# Patient Record
Sex: Female | Born: 1955
Health system: Southern US, Community
[De-identification: ages and names within clinical notes are randomized; demographics above are authoritative.]

## PROBLEM LIST (undated history)

## (undated) DIAGNOSIS — R7303 Prediabetes: Secondary | ICD-10-CM

## (undated) DIAGNOSIS — M1710 Unilateral primary osteoarthritis, unspecified knee: Secondary | ICD-10-CM

## (undated) DIAGNOSIS — N950 Postmenopausal bleeding: Secondary | ICD-10-CM

## (undated) DIAGNOSIS — I10 Essential (primary) hypertension: Secondary | ICD-10-CM

## (undated) DIAGNOSIS — M549 Dorsalgia, unspecified: Secondary | ICD-10-CM

## (undated) DIAGNOSIS — M2550 Pain in unspecified joint: Secondary | ICD-10-CM

## (undated) DIAGNOSIS — Z6830 Body mass index (BMI) 30.0-30.9, adult: Secondary | ICD-10-CM

## (undated) DIAGNOSIS — E669 Obesity, unspecified: Secondary | ICD-10-CM

## (undated) DIAGNOSIS — E7800 Pure hypercholesterolemia, unspecified: Secondary | ICD-10-CM

## (undated) DIAGNOSIS — E785 Hyperlipidemia, unspecified: Secondary | ICD-10-CM

## (undated) HISTORY — DX: Pure hypercholesterolemia, unspecified: E78.00

## (undated) HISTORY — PX: COLONOSCOPY: SHX174

## (undated) HISTORY — DX: Obesity, unspecified: E66.9

## (undated) HISTORY — DX: Dorsalgia, unspecified: M54.9

## (undated) HISTORY — DX: Pain in unspecified joint: M25.50

## (undated) HISTORY — DX: Prediabetes: R73.03

## (undated) HISTORY — DX: Unilateral primary osteoarthritis, unspecified knee: M17.10

## (undated) HISTORY — DX: Body mass index (BMI) 30.0-30.9, adult: Z68.30

---

## 1997-08-21 ENCOUNTER — Ambulatory Visit (HOSPITAL_COMMUNITY): Admission: RE | Admit: 1997-08-21 | Discharge: 1997-08-21 | Payer: Self-pay | Admitting: Internal Medicine

## 1998-09-21 ENCOUNTER — Ambulatory Visit (HOSPITAL_COMMUNITY): Admission: RE | Admit: 1998-09-21 | Discharge: 1998-09-21 | Payer: Self-pay | Admitting: Internal Medicine

## 1998-09-21 ENCOUNTER — Encounter: Payer: Self-pay | Admitting: Internal Medicine

## 1998-12-16 ENCOUNTER — Ambulatory Visit (HOSPITAL_COMMUNITY): Admission: RE | Admit: 1998-12-16 | Discharge: 1998-12-16 | Payer: Self-pay | Admitting: Internal Medicine

## 1999-09-23 ENCOUNTER — Encounter: Payer: Self-pay | Admitting: Internal Medicine

## 1999-09-23 ENCOUNTER — Ambulatory Visit (HOSPITAL_COMMUNITY): Admission: RE | Admit: 1999-09-23 | Discharge: 1999-09-23 | Payer: Self-pay | Admitting: Internal Medicine

## 2001-11-05 ENCOUNTER — Encounter: Payer: Self-pay | Admitting: Internal Medicine

## 2001-11-05 ENCOUNTER — Ambulatory Visit (HOSPITAL_COMMUNITY): Admission: RE | Admit: 2001-11-05 | Discharge: 2001-11-05 | Payer: Self-pay | Admitting: Internal Medicine

## 2002-11-21 ENCOUNTER — Encounter: Payer: Self-pay | Admitting: Internal Medicine

## 2002-11-21 ENCOUNTER — Ambulatory Visit (HOSPITAL_COMMUNITY): Admission: RE | Admit: 2002-11-21 | Discharge: 2002-11-21 | Payer: Self-pay | Admitting: Internal Medicine

## 2004-01-18 ENCOUNTER — Ambulatory Visit (HOSPITAL_COMMUNITY): Admission: RE | Admit: 2004-01-18 | Discharge: 2004-01-18 | Payer: Self-pay | Admitting: Internal Medicine

## 2005-12-27 ENCOUNTER — Emergency Department (HOSPITAL_COMMUNITY): Admission: EM | Admit: 2005-12-27 | Discharge: 2005-12-27 | Payer: Self-pay | Admitting: Emergency Medicine

## 2005-12-29 ENCOUNTER — Emergency Department (HOSPITAL_COMMUNITY): Admission: EM | Admit: 2005-12-29 | Discharge: 2005-12-29 | Payer: Self-pay | Admitting: Emergency Medicine

## 2007-05-16 ENCOUNTER — Ambulatory Visit (HOSPITAL_COMMUNITY): Admission: RE | Admit: 2007-05-16 | Discharge: 2007-05-16 | Payer: Self-pay | Admitting: Internal Medicine

## 2007-05-21 ENCOUNTER — Encounter: Admission: RE | Admit: 2007-05-21 | Discharge: 2007-05-21 | Payer: Self-pay | Admitting: Internal Medicine

## 2008-05-21 ENCOUNTER — Ambulatory Visit (HOSPITAL_COMMUNITY): Admission: RE | Admit: 2008-05-21 | Discharge: 2008-05-21 | Payer: Self-pay | Admitting: Internal Medicine

## 2009-01-14 ENCOUNTER — Ambulatory Visit (HOSPITAL_COMMUNITY): Admission: RE | Admit: 2009-01-14 | Discharge: 2009-01-14 | Payer: Self-pay | Admitting: Gastroenterology

## 2009-01-14 ENCOUNTER — Encounter (INDEPENDENT_AMBULATORY_CARE_PROVIDER_SITE_OTHER): Payer: Self-pay | Admitting: Gastroenterology

## 2009-05-24 ENCOUNTER — Emergency Department (HOSPITAL_COMMUNITY): Admission: EM | Admit: 2009-05-24 | Discharge: 2009-05-24 | Payer: Self-pay | Admitting: Family Medicine

## 2009-06-04 ENCOUNTER — Ambulatory Visit (HOSPITAL_COMMUNITY): Admission: RE | Admit: 2009-06-04 | Discharge: 2009-06-04 | Payer: Self-pay | Admitting: Internal Medicine

## 2010-07-24 ENCOUNTER — Encounter: Payer: Self-pay | Admitting: Internal Medicine

## 2010-11-15 NOTE — Op Note (Signed)
Anne [name], WINWARD                ACCOUNT NO.:  [id number]   MEDICAL RECORD NO.:  [id number]          PATIENT TYPE:  AMB   LOCATION:  ENDO                         FACILITY:  [facility]   PHYSICIAN:  Anselmo Rod, M.D.  DATE OF BIRTH:  Mar 26, 1956   DATE OF PROCEDURE:  01/14/2009  DATE OF DISCHARGE:  01/14/2009                               OPERATIVE REPORT   PROCEDURE PERFORMED:  Colonoscopy with cold snare polypectomy x 1.   ENDOSCOPIST:  Anselmo Rod, M.D.   INSTRUMENT USED:  Pentax video colonoscope.   INDICATIONS FOR PROCEDURE:  A 55 year old black female undergoing  screening colonoscopy to rule out colonic polyps, masses, etc.   PREPROCEDURE PREPARATION:  Informed consent was procured from the  patient.  The patient was fasted for 4 hours prior to the procedure and  prepped with MoviPrep the night prior to the procedure and morning of  the procedure.  Risks and benefits of the procedure including a 10%  missed rate of cancer and polyp were discussed with the patient as well.   PREPROCEDURE PHYSICAL:  VITAL SIGNS:  The patient had stable vital  signs.  NECK:  Supple.  CHEST:  Clear to auscultation.  HEART:  S1, S2 regular.  ABDOMEN:  Abdomen soft with normal bowel sounds.   DESCRIPTION OF PROCEDURE:  The patient was placed in the left lateral  decubitus position and sedated with 175 mg of Propofol and 2 mg of  Versed given intravenously in slow incremental doses.  Once the patient  was adequately sedated and maintained on low-flow oxygen and continuous  cardiac monitoring the Pentax video colonoscope was advanced from the  rectum to the cecum. A small sessile polyp was removed by cold snare  from the cecum.  Isolated diverticulum was seen in the left colon.  Small internal hemorrhoids were seen on retroflexion. The colonic mucosa  appeared healthy with a normal vascular pattern. The patient tolerated  the procedure well without immediate complications.   IMPRESSION:  1. Small sessile polyp removed by cold snare from the cecum.  2. Isolated diverticulum in the left colon.  3. Small internal hemorrhoids seen on retroflexion.   RECOMMENDATIONS:  1. Await pathology results.  2. Avoid all nonsteroidals including aspirin for the next 2 weeks.  3. Repeat colonoscopy depending on pathology results.  4. Outpatient followup as need arises in the future.      Anselmo Rod, M.D.  Electronically Signed     JNM/MEDQ  D:  01/15/2009  T:  01/16/2009  Job:  [id number]

## 2012-03-09 ENCOUNTER — Ambulatory Visit: Payer: Self-pay | Admitting: Family Medicine

## 2012-03-09 VITALS — BP 122/85 | HR 80 | Temp 100.0°F | Resp 16 | Ht 63.5 in | Wt 221.2 lb

## 2012-03-09 DIAGNOSIS — M79643 Pain in unspecified hand: Secondary | ICD-10-CM

## 2012-03-09 DIAGNOSIS — S61419A Laceration without foreign body of unspecified hand, initial encounter: Secondary | ICD-10-CM

## 2012-03-09 DIAGNOSIS — M25539 Pain in unspecified wrist: Secondary | ICD-10-CM

## 2012-03-09 NOTE — Progress Notes (Signed)
[facility] [address] [place] [address] [phone]  Date:  03/09/2012   Name:  Anne [name]   DOB:  07-23-1955   MRN:  [id number]  PCP:  No primary provider on file.    Chief Complaint: Laceration   History of Present Illness:  Anne [name] is a 56 y.o. very pleasant female patient who presents with the following:  She was looking at some new knives at [facility] today when she accidentally cut her right wrist on the dorsal side.  Her tetanus shot is UTD, it was a clean wound.  She is otherwise unhurt and has not other concerns today.  She does not really feel that she has numbness in her hand, but the hand does feel a little strange- she will have sharp pains down the fingers sometimes, and she might notice a little tingling.  Her tetanus shot is UTD  There is no problem list on file for this patient.   No past medical history on file.  No past surgical history on file.  History  Substance Use Topics  . Smoking status: Never Smoker   . Smokeless tobacco: Not on file  . Alcohol Use: Not on file    No family history on file.  No Known Allergies  Medication list has been reviewed and updated.  No current outpatient prescriptions on file prior to visit.    Review of Systems:  As per HPI- otherwise negative.   Physical Examination: Filed Vitals:   03/09/12 1451  BP: 170/110  Pulse: 80  Temp: 100 F (37.8 C)  Resp: 16   Filed Vitals:   03/09/12 1451  Height: 5' 3.5" (1.613 m)  Weight: 221 lb 3.2 oz (100.336 kg)   Body mass index is 38.57 kg/(m^2). Ideal Body Weight: Weight in (lb) to have BMI = 25: 143.1    GEN: WDWN, NAD, Non-toxic, Alert & Oriented x 3 HEENT: Atraumatic, Normocephalic.  Ears and Nose: No external deformity. EXTR: No clubbing/cyanosis/edema NEURO: Normal gait.  PSYCH: Normally interactive. Conversant. Not depressed or anxious appearing.  Calm demeanor.  Right UE: there is a small, clean wound on the dorsum  of her right wrist near the hand.  It does not appear to be deep.  She has good strength of her hand, except she is slightly limited in her grip by pain. In comparison to the other hand she cannot discern any significant difference of sensation.  Able to flex and extend the wrist, and adduct/ abduct fingers normally.    Assessment and Plan:  1. Pain, hand   2. Laceration of hand    See repair note per Porfirio Oar, PA-C.  Rechecked her BP and she went home.  If non- specific parasthesias continue please let me know.   Abbe Amsterdam, MD

## 2012-03-09 NOTE — Patient Instructions (Addendum)

## 2012-03-09 NOTE — Progress Notes (Signed)
Verbal consent obtained from the patient.  Local anesthesia with 1cc Lidocaine 2% with epinephrine.  Wound scrubbed with soap and water and rinsed.  Wound closed with 1 5-0 Ethilon horizontal mattress suture.  Wound cleansed and dressed.

## 2012-07-19 ENCOUNTER — Other Ambulatory Visit (HOSPITAL_COMMUNITY): Payer: Self-pay | Admitting: Internal Medicine

## 2012-07-19 DIAGNOSIS — Z1231 Encounter for screening mammogram for malignant neoplasm of breast: Secondary | ICD-10-CM

## 2012-07-24 ENCOUNTER — Ambulatory Visit (HOSPITAL_COMMUNITY): Payer: Self-pay

## 2012-08-01 ENCOUNTER — Ambulatory Visit (HOSPITAL_COMMUNITY)
Admission: RE | Admit: 2012-08-01 | Discharge: 2012-08-01 | Disposition: A | Discharge status: Home / Self Care | Payer: BC Managed Care – PPO | Source: Ambulatory Visit | Attending: Internal Medicine | Admitting: Internal Medicine

## 2012-08-01 DIAGNOSIS — Z1231 Encounter for screening mammogram for malignant neoplasm of breast: Secondary | ICD-10-CM | POA: Insufficient documentation

## 2013-09-26 ENCOUNTER — Ambulatory Visit
Admission: RE | Admit: 2013-09-26 | Discharge: 2013-09-26 | Disposition: A | Discharge status: Home / Self Care | Payer: No Typology Code available for payment source | Source: Ambulatory Visit | Attending: Internal Medicine | Admitting: Internal Medicine

## 2013-09-26 ENCOUNTER — Other Ambulatory Visit: Payer: Self-pay | Admitting: Internal Medicine

## 2013-09-26 DIAGNOSIS — A159 Respiratory tuberculosis unspecified: Secondary | ICD-10-CM

## 2013-10-06 ENCOUNTER — Other Ambulatory Visit (HOSPITAL_COMMUNITY): Payer: Self-pay | Admitting: Internal Medicine

## 2013-10-06 DIAGNOSIS — Z1231 Encounter for screening mammogram for malignant neoplasm of breast: Secondary | ICD-10-CM

## 2013-10-08 ENCOUNTER — Ambulatory Visit (HOSPITAL_COMMUNITY)
Admission: RE | Admit: 2013-10-08 | Discharge: 2013-10-08 | Disposition: A | Discharge status: Home / Self Care | Payer: No Typology Code available for payment source | Source: Ambulatory Visit | Attending: Internal Medicine | Admitting: Internal Medicine

## 2013-10-08 DIAGNOSIS — Z1231 Encounter for screening mammogram for malignant neoplasm of breast: Secondary | ICD-10-CM | POA: Insufficient documentation

## 2017-12-27 ENCOUNTER — Ambulatory Visit: Payer: Self-pay | Admitting: Family Medicine

## 2017-12-27 VITALS — BP 140/90 | HR 83 | Temp 98.7°F | Resp 16 | Ht 65.0 in | Wt 219.6 lb

## 2017-12-27 DIAGNOSIS — M25561 Pain in right knee: Secondary | ICD-10-CM

## 2017-12-27 DIAGNOSIS — Z0000 Encounter for general adult medical examination without abnormal findings: Secondary | ICD-10-CM

## 2017-12-27 MED ORDER — MELOXICAM 15 MG PO TABS: 15.0000 mg | ORAL_TABLET | Freq: Every day | ORAL | 0 refills | Status: DC

## 2017-12-27 NOTE — Progress Notes (Signed)
Anne [name] is a 62 y.o. female who presents today with concerns of need for a employment directed physical exam. She denies chronic health conditions at this time.  Review of Systems  Constitutional: Negative for chills, fever and malaise/fatigue.  HENT: Negative for congestion, ear discharge, ear pain, sinus pain and sore throat.   Eyes: Negative.   Respiratory: Negative for cough, sputum production and shortness of breath.   Cardiovascular: Negative.  Negative for chest pain.  Gastrointestinal: Negative for abdominal pain, diarrhea, nausea and vomiting.  Genitourinary: Negative for dysuria, frequency, hematuria and urgency.  Musculoskeletal: Negative for myalgias.  Skin: Negative.   Neurological: Negative for headaches.  Endo/Heme/Allergies: Negative.   Psychiatric/Behavioral: Negative.     O: Vitals:   12/27/17 1107 12/27/17 1120  BP: (!) 160/90 140/90  Pulse: 83   Resp: 16   Temp: 98.7 F (37.1 C)   SpO2: 99%      Physical Exam  Constitutional: She is oriented to person, place, and time. Vital signs are normal. She appears well-developed and well-nourished. She is active.  Non-toxic appearance. She does not have a sickly appearance.  HENT:  Head: Normocephalic.  Right Ear: Hearing, tympanic membrane, external ear and ear canal normal.  Left Ear: Hearing, tympanic membrane, external ear and ear canal normal.  Nose: Nose normal.  Mouth/Throat: Uvula is midline and oropharynx is clear and moist.  Neck: Normal range of motion. Neck supple.  Cardiovascular: Normal rate, regular rhythm, normal heart sounds and normal pulses.  Pulmonary/Chest: Effort normal and breath sounds normal.  Abdominal: Soft. Bowel sounds are normal.  Musculoskeletal: Normal range of motion.  Lymphadenopathy:       Head (right side): No submental and no submandibular adenopathy present.       Head (left side): No submental and no submandibular adenopathy present.    She has no cervical  adenopathy.  Neurological: She is alert and oriented to person, place, and time.  Psychiatric: She has a normal mood and affect.  Vitals reviewed.  A: 1. Physical exam   2. Right knee pain, unspecified chronicity    P: Exam findings, diagnosis etiology and medication use and indications reviewed with patient. Follow- Up and discharge instructions provided. No emergent/urgent issues found on exam.  Patient verbalized understanding of information provided and agrees with plan of care (POC), all questions answered.  1. Physical exam Failed Hearing screen- advised PCP f/u and eval Elevated BP- repeat WNL Right knee pain found on exam- patient reports has persisted for a few months worse with prolonged activity Advised PCP f/u for all conditions- did acutely treat knee pain 2. Right knee pain, unspecified chronicity Will treat conservatively advised- rest, knee brace- off the shelf- Mobic daily for 2 weeks and Tylenol arthritis strength for breakthrough pain and advised PCP f/u - meloxicam (MOBIC) 15 MG tablet; Take 1 tablet (15 mg total) by mouth daily. With largest meal of the day

## 2017-12-27 NOTE — Patient Instructions (Addendum)
Health Maintenance, Female Adopting a healthy lifestyle and getting preventive care can go a long way to promote health and wellness. Talk with your health care provider about what schedule of regular examinations is right for you. This is a good chance for you to check in with your provider about disease prevention and staying healthy. In between checkups, there are plenty of things you can do on your own. Experts have done a lot of research about which lifestyle changes and preventive measures are most likely to keep you healthy. Ask your health care provider for more information. Weight and diet Eat a healthy diet  Be sure to include plenty of vegetables, fruits, low-fat dairy products, and lean protein.  Do not eat a lot of foods high in solid fats, added sugars, or salt.  Get regular exercise. This is one of the most important things you can do for your health. ? Most adults should exercise for at least 150 minutes each week. The exercise should increase your heart rate and make you sweat (moderate-intensity exercise). ? Most adults should also do strengthening exercises at least twice a week. This is in addition to the moderate-intensity exercise.  Maintain a healthy weight  Body mass index (BMI) is a measurement that can be used to identify possible weight problems. It estimates body fat based on height and weight. Your health care provider can help determine your BMI and help you achieve or maintain a healthy weight.  For females 20 years of age and older: ? A BMI below 18.5 is considered underweight. ? A BMI of 18.5 to 24.9 is normal. ? A BMI of 25 to 29.9 is considered overweight. ? A BMI of 30 and above is considered obese.  Watch levels of cholesterol and blood lipids  You should start having your blood tested for lipids and cholesterol at 62 years of age, then have this test every 5 years.  You may need to have your cholesterol levels checked more often if: ? Your lipid or  cholesterol levels are high. ? You are older than 62 years of age. ? You are at high risk for heart disease.  Cancer screening Lung Cancer  Lung cancer screening is recommended for adults 55-80 years old who are at high risk for lung cancer because of a history of smoking.  A yearly low-dose CT scan of the lungs is recommended for people who: ? Currently smoke. ? Have quit within the past 15 years. ? Have at least a 30-pack-year history of smoking. A pack year is smoking an average of one pack of cigarettes a day for 1 year.  Yearly screening should continue until it has been 15 years since you quit.  Yearly screening should stop if you develop a health problem that would prevent you from having lung cancer treatment.  Breast Cancer  Practice breast self-awareness. This means understanding how your breasts normally appear and feel.  It also means doing regular breast self-exams. Let your health care provider know about any changes, no matter how small.  If you are in your 20s or 30s, you should have a clinical breast exam (CBE) by a health care provider every 1-3 years as part of a regular health exam.  If you are 40 or older, have a CBE every year. Also consider having a breast X-ray (mammogram) every year.  If you have a family history of breast cancer, talk to your health care provider about genetic screening.  If you are at high risk   for breast cancer, talk to your health care provider about having an MRI and a mammogram every year.  Breast cancer gene (BRCA) assessment is recommended for women who have family members with BRCA-related cancers. BRCA-related cancers include: ? Breast. ? Ovarian. ? Tubal. ? Peritoneal cancers.  Results of the assessment will determine the need for genetic counseling and BRCA1 and BRCA2 testing.  Cervical Cancer Your health care provider may recommend that you be screened regularly for cancer of the pelvic organs (ovaries, uterus, and  vagina). This screening involves a pelvic examination, including checking for microscopic changes to the surface of your cervix (Pap test). You may be encouraged to have this screening done every 3 years, beginning at age 22.  For women ages 56-65, health care providers may recommend pelvic exams and Pap testing every 3 years, or they may recommend the Pap and pelvic exam, combined with testing for human papilloma virus (HPV), every 5 years. Some types of HPV increase your risk of cervical cancer. Testing for HPV may also be done on women of any age with unclear Pap test results.  Other health care providers may not recommend any screening for nonpregnant women who are considered low risk for pelvic cancer and who do not have symptoms. Ask your health care provider if a screening pelvic exam is right for you.  If you have had past treatment for cervical cancer or a condition that could lead to cancer, you need Pap tests and screening for cancer for at least 20 years after your treatment. If Pap tests have been discontinued, your risk factors (such as having a new sexual partner) need to be reassessed to determine if screening should resume. Some women have medical problems that increase the chance of getting cervical cancer. In these cases, your health care provider may recommend more frequent screening and Pap tests.  Colorectal Cancer  This type of cancer can be detected and often prevented.  Routine colorectal cancer screening usually begins at 62 years of age and continues through 62 years of age.  Your health care provider may recommend screening at an earlier age if you have risk factors for colon cancer.  Your health care provider may also recommend using home test kits to check for hidden blood in the stool.  A small camera at the end of a tube can be used to examine your colon directly (sigmoidoscopy or colonoscopy). This is done to check for the earliest forms of colorectal  cancer.  Routine screening usually begins at age 33.  Direct examination of the colon should be repeated every 5-10 years through 62 years of age. However, you may need to be screened more often if early forms of precancerous polyps or small growths are found.  Skin Cancer  Check your skin from head to toe regularly.  Tell your health care provider about any new moles or changes in moles, especially if there is a change in a mole's shape or color.  Also tell your health care provider if you have a mole that is larger than the size of a pencil eraser.  Always use sunscreen. Apply sunscreen liberally and repeatedly throughout the day.  Protect yourself by wearing long sleeves, pants, a wide-brimmed hat, and sunglasses whenever you are outside.  Heart disease, diabetes, and high blood pressure  High blood pressure causes heart disease and increases the risk of stroke. High blood pressure is more likely to develop in: ? People who have blood pressure in the high end of  the normal range (130-139/85-89 mm Hg). ? People who are overweight or obese. ? People who are African American.  If you are 21-29 years of age, have your blood pressure checked every 3-5 years. If you are 3 years of age or older, have your blood pressure checked every year. You should have your blood pressure measured twice-once when you are at a hospital or clinic, and once when you are not at a hospital or clinic. Record the average of the two measurements. To check your blood pressure when you are not at a hospital or clinic, you can use: ? An automated blood pressure machine at a pharmacy. ? A home blood pressure monitor.  If you are between 17 years and 37 years old, ask your health care provider if you should take aspirin to prevent strokes.  Have regular diabetes screenings. This involves taking a blood sample to check your fasting blood sugar level. ? If you are at a normal weight and have a low risk for diabetes,  have this test once every three years after 62 years of age. ? If you are overweight and have a high risk for diabetes, consider being tested at a younger age or more often. Preventing infection Hepatitis B  If you have a higher risk for hepatitis B, you should be screened for this virus. You are considered at high risk for hepatitis B if: ? You were born in a country where hepatitis B is common. Ask your health care provider which countries are considered high risk. ? Your parents were born in a high-risk country, and you have not been immunized against hepatitis B (hepatitis B vaccine). ? You have HIV or AIDS. ? You use needles to inject street drugs. ? You live with someone who has hepatitis B. ? You have had sex with someone who has hepatitis B. ? You get hemodialysis treatment. ? You take certain medicines for conditions, including cancer, organ transplantation, and autoimmune conditions.  Hepatitis C  Blood testing is recommended for: ? Everyone born from [birth year] through 1965. ? Anyone with known risk factors for hepatitis C.  Sexually transmitted infections (STIs)  You should be screened for sexually transmitted infections (STIs) including gonorrhea and chlamydia if: ? You are sexually active and are younger than 62 years of age. ? You are older than 62 years of age and your health care provider tells you that you are at risk for this type of infection. ? Your sexual activity has changed since you were last screened and you are at an increased risk for chlamydia or gonorrhea. Ask your health care provider if you are at risk.  If you do not have HIV, but are at risk, it may be recommended that you take a prescription medicine daily to prevent HIV infection. This is called pre-exposure prophylaxis (PrEP). You are considered at risk if: ? You are sexually active and do not regularly use condoms or know the HIV status of your partner(s). ? You take drugs by injection. ? You are  sexually active with a partner who has HIV.  Talk with your health care provider about whether you are at high risk of being infected with HIV. If you choose to begin PrEP, you should first be tested for HIV. You should then be tested every 3 months for as long as you are taking PrEP. Pregnancy  If you are premenopausal and you may become pregnant, ask your health care provider about preconception counseling.  If you may become  pregnant, take 400 to 800 micrograms (mcg) of folic acid every day.  If you want to prevent pregnancy, talk to your health care provider about birth control (contraception). Osteoporosis and menopause  Osteoporosis is a disease in which the bones lose minerals and strength with aging. This can result in serious bone fractures. Your risk for osteoporosis can be identified using a bone density scan.  If you are 65 years of age or older, or if you are at risk for osteoporosis and fractures, ask your health care provider if you should be screened.  Ask your health care provider whether you should take a calcium or vitamin D supplement to lower your risk for osteoporosis.  Menopause may have certain physical symptoms and risks.  Hormone replacement therapy may reduce some of these symptoms and risks. Talk to your health care provider about whether hormone replacement therapy is right for you. Follow these instructions at home:  Schedule regular health, dental, and eye exams.  Stay current with your immunizations.  Do not use any tobacco products including cigarettes, chewing tobacco, or electronic cigarettes.  If you are pregnant, do not drink alcohol.  If you are breastfeeding, limit how much and how often you drink alcohol.  Limit alcohol intake to no more than 1 drink per day for nonpregnant women. One drink equals 12 ounces of beer, 5 ounces of wine, or 1 ounces of hard liquor.  Do not use street drugs.  Do not share needles.  Ask your health care  provider for help if you need support or information about quitting drugs.  Tell your health care provider if you often feel depressed.  Tell your health care provider if you have ever been abused or do not feel safe at home. This information is not intended to replace advice given to you by your health care provider. Make sure you discuss any questions you have with your health care provider. Document Released: 01/02/2011 Document Revised: 11/25/2015 Document Reviewed: 03/23/2015 Elsevier Interactive Patient Education  2018 [employer].  Knee Pain, Adult Knee pain in adults is common. It can be caused by many things, including:  Arthritis.  A fluid-filled sac (cyst) or growth in your knee.  An infection in your knee.  An injury that will not heal.  Damage, swelling, or irritation of the tissues that support your knee.  Knee pain is usually not a sign of a serious problem. The pain may go away on its own with time and rest. If it does not, a health care provider may order tests to find the cause of the pain. These may include:  Imaging tests, such as an X-ray, MRI, or ultrasound.  Joint aspiration. In this test, fluid is removed from the knee.  Arthroscopy. In this test, a lighted tube is inserted into knee and an image is projected onto a TV screen.  A biopsy. In this test, a sample of tissue is removed from the body and studied under a microscope.  Follow these instructions at home: Pay attention to any changes in your symptoms. Take these actions to relieve your pain. Activity  Rest your knee.  Do not do things that cause pain or make pain worse.  Avoid high-impact activities or exercises, such as running, jumping rope, or doing jumping jacks. General instructions  Take over-the-counter and prescription medicines only as told by your health care provider.  Raise (elevate) your knee above the level of your heart when you are sitting or lying down.  Sleep with  a  pillow under your knee.  If directed, apply ice to the knee: ? Put ice in a plastic bag. ? Place a towel between your skin and the bag. ? Leave the ice on for 20 minutes, 2-3 times a day.  Ask your health care provider if you should wear an elastic knee support.  Lose weight if you are overweight. Extra weight can put pressure on your knee.  Do not use any products that contain nicotine or tobacco, such as cigarettes and e-cigarettes. Smoking may slow the healing of any bone and joint problems that you may have. If you need help quitting, ask your health care provider. Contact a health care provider if:  Your knee pain continues, changes, or gets worse.  You have a fever along with knee pain.  Your knee buckles or locks up.  Your knee swells, and the swelling becomes worse. Get help right away if:  Your knee feels warm to the touch.  You cannot move your knee.  You have severe pain in your knee.  You have chest pain.  You have trouble breathing. Summary  Knee pain in adults is common. It can be caused by many things, including, arthritis, infection, cysts, or injury.  Knee pain is usually not a sign of a serious problem, but if it does not go away, a health care provider may perform tests to know the cause of the pain.  Pay attention to any changes in your symptoms. Relieve your pain with rest, medicines, light activity, and use of ice.  Get help if your pain continues or becomes very severe, or if your knee buckles or locks up, or if you have chest pain or trouble breathing. This information is not intended to replace advice given to you by your health care provider. Make sure you discuss any questions you have with your health care provider. Document Released: 04/16/2007 Document Revised: 06/09/2016 Document Reviewed: 06/09/2016 Elsevier Interactive Patient Education  [employer].

## 2018-11-08 DIAGNOSIS — M545 Low back pain: Secondary | ICD-10-CM | POA: Diagnosis not present

## 2018-11-08 DIAGNOSIS — S8391XA Sprain of unspecified site of right knee, initial encounter: Secondary | ICD-10-CM | POA: Diagnosis not present

## 2018-11-08 DIAGNOSIS — S339XXA Sprain of unspecified parts of lumbar spine and pelvis, initial encounter: Secondary | ICD-10-CM | POA: Diagnosis not present

## 2018-11-12 DIAGNOSIS — S8391XA Sprain of unspecified site of right knee, initial encounter: Secondary | ICD-10-CM | POA: Diagnosis not present

## 2018-11-12 DIAGNOSIS — S339XXA Sprain of unspecified parts of lumbar spine and pelvis, initial encounter: Secondary | ICD-10-CM | POA: Diagnosis not present

## 2018-11-12 DIAGNOSIS — M545 Low back pain: Secondary | ICD-10-CM | POA: Diagnosis not present

## 2018-11-14 DIAGNOSIS — S339XXA Sprain of unspecified parts of lumbar spine and pelvis, initial encounter: Secondary | ICD-10-CM | POA: Diagnosis not present

## 2018-11-14 DIAGNOSIS — S8391XA Sprain of unspecified site of right knee, initial encounter: Secondary | ICD-10-CM | POA: Diagnosis not present

## 2018-11-14 DIAGNOSIS — M545 Low back pain: Secondary | ICD-10-CM | POA: Diagnosis not present

## 2018-11-15 DIAGNOSIS — M545 Low back pain: Secondary | ICD-10-CM | POA: Diagnosis not present

## 2018-11-15 DIAGNOSIS — S339XXA Sprain of unspecified parts of lumbar spine and pelvis, initial encounter: Secondary | ICD-10-CM | POA: Diagnosis not present

## 2018-11-15 DIAGNOSIS — S8391XA Sprain of unspecified site of right knee, initial encounter: Secondary | ICD-10-CM | POA: Diagnosis not present

## 2018-11-18 DIAGNOSIS — S339XXA Sprain of unspecified parts of lumbar spine and pelvis, initial encounter: Secondary | ICD-10-CM | POA: Diagnosis not present

## 2018-11-18 DIAGNOSIS — M545 Low back pain: Secondary | ICD-10-CM | POA: Diagnosis not present

## 2018-11-18 DIAGNOSIS — S8391XA Sprain of unspecified site of right knee, initial encounter: Secondary | ICD-10-CM | POA: Diagnosis not present

## 2018-11-20 DIAGNOSIS — S8391XA Sprain of unspecified site of right knee, initial encounter: Secondary | ICD-10-CM | POA: Diagnosis not present

## 2018-11-20 DIAGNOSIS — S339XXA Sprain of unspecified parts of lumbar spine and pelvis, initial encounter: Secondary | ICD-10-CM | POA: Diagnosis not present

## 2018-11-20 DIAGNOSIS — M545 Low back pain: Secondary | ICD-10-CM | POA: Diagnosis not present

## 2018-11-22 DIAGNOSIS — S339XXA Sprain of unspecified parts of lumbar spine and pelvis, initial encounter: Secondary | ICD-10-CM | POA: Diagnosis not present

## 2018-11-22 DIAGNOSIS — S8391XA Sprain of unspecified site of right knee, initial encounter: Secondary | ICD-10-CM | POA: Diagnosis not present

## 2018-11-22 DIAGNOSIS — M545 Low back pain: Secondary | ICD-10-CM | POA: Diagnosis not present

## 2018-11-26 DIAGNOSIS — S339XXA Sprain of unspecified parts of lumbar spine and pelvis, initial encounter: Secondary | ICD-10-CM | POA: Diagnosis not present

## 2018-11-26 DIAGNOSIS — M545 Low back pain: Secondary | ICD-10-CM | POA: Diagnosis not present

## 2018-11-26 DIAGNOSIS — S8391XA Sprain of unspecified site of right knee, initial encounter: Secondary | ICD-10-CM | POA: Diagnosis not present

## 2018-11-29 DIAGNOSIS — M545 Low back pain: Secondary | ICD-10-CM | POA: Diagnosis not present

## 2018-11-29 DIAGNOSIS — S339XXA Sprain of unspecified parts of lumbar spine and pelvis, initial encounter: Secondary | ICD-10-CM | POA: Diagnosis not present

## 2018-11-29 DIAGNOSIS — S8391XA Sprain of unspecified site of right knee, initial encounter: Secondary | ICD-10-CM | POA: Diagnosis not present

## 2018-12-02 DIAGNOSIS — M545 Low back pain: Secondary | ICD-10-CM | POA: Diagnosis not present

## 2018-12-02 DIAGNOSIS — S8391XA Sprain of unspecified site of right knee, initial encounter: Secondary | ICD-10-CM | POA: Diagnosis not present

## 2018-12-02 DIAGNOSIS — S339XXA Sprain of unspecified parts of lumbar spine and pelvis, initial encounter: Secondary | ICD-10-CM | POA: Diagnosis not present

## 2018-12-04 DIAGNOSIS — M545 Low back pain: Secondary | ICD-10-CM | POA: Diagnosis not present

## 2018-12-04 DIAGNOSIS — S339XXA Sprain of unspecified parts of lumbar spine and pelvis, initial encounter: Secondary | ICD-10-CM | POA: Diagnosis not present

## 2018-12-04 DIAGNOSIS — S8391XA Sprain of unspecified site of right knee, initial encounter: Secondary | ICD-10-CM | POA: Diagnosis not present

## 2019-03-13 DIAGNOSIS — K5900 Constipation, unspecified: Secondary | ICD-10-CM | POA: Diagnosis not present

## 2019-03-13 DIAGNOSIS — Z1211 Encounter for screening for malignant neoplasm of colon: Secondary | ICD-10-CM | POA: Diagnosis not present

## 2019-03-13 DIAGNOSIS — Z86010 Personal history of colonic polyps: Secondary | ICD-10-CM | POA: Diagnosis not present

## 2019-04-14 DIAGNOSIS — Z136 Encounter for screening for cardiovascular disorders: Secondary | ICD-10-CM | POA: Diagnosis not present

## 2019-04-14 DIAGNOSIS — R030 Elevated blood-pressure reading, without diagnosis of hypertension: Secondary | ICD-10-CM | POA: Diagnosis not present

## 2019-04-14 DIAGNOSIS — Z131 Encounter for screening for diabetes mellitus: Secondary | ICD-10-CM | POA: Diagnosis not present

## 2019-04-14 DIAGNOSIS — Z23 Encounter for immunization: Secondary | ICD-10-CM | POA: Diagnosis not present

## 2019-04-14 DIAGNOSIS — Z1231 Encounter for screening mammogram for malignant neoplasm of breast: Secondary | ICD-10-CM | POA: Diagnosis not present

## 2019-04-14 DIAGNOSIS — Z0000 Encounter for general adult medical examination without abnormal findings: Secondary | ICD-10-CM | POA: Diagnosis not present

## 2019-04-14 DIAGNOSIS — Z01419 Encounter for gynecological examination (general) (routine) without abnormal findings: Secondary | ICD-10-CM | POA: Diagnosis not present

## 2019-04-14 DIAGNOSIS — Z1159 Encounter for screening for other viral diseases: Secondary | ICD-10-CM | POA: Diagnosis not present

## 2019-04-14 DIAGNOSIS — Z1211 Encounter for screening for malignant neoplasm of colon: Secondary | ICD-10-CM | POA: Diagnosis not present

## 2019-04-15 DIAGNOSIS — Z1159 Encounter for screening for other viral diseases: Secondary | ICD-10-CM | POA: Diagnosis not present

## 2019-04-15 DIAGNOSIS — Z1211 Encounter for screening for malignant neoplasm of colon: Secondary | ICD-10-CM | POA: Diagnosis not present

## 2019-04-15 DIAGNOSIS — Z136 Encounter for screening for cardiovascular disorders: Secondary | ICD-10-CM | POA: Diagnosis not present

## 2019-04-15 DIAGNOSIS — Z131 Encounter for screening for diabetes mellitus: Secondary | ICD-10-CM | POA: Diagnosis not present

## 2019-04-16 ENCOUNTER — Other Ambulatory Visit: Payer: Self-pay | Admitting: Internal Medicine

## 2019-04-16 DIAGNOSIS — Z1231 Encounter for screening mammogram for malignant neoplasm of breast: Secondary | ICD-10-CM

## 2019-04-17 ENCOUNTER — Ambulatory Visit
Admission: RE | Admit: 2019-04-17 | Discharge: 2019-04-17 | Disposition: A | Discharge status: Home / Self Care | Payer: 59 | Source: Ambulatory Visit | Attending: Internal Medicine | Admitting: Internal Medicine

## 2019-04-17 ENCOUNTER — Other Ambulatory Visit: Payer: Self-pay | Admitting: Internal Medicine

## 2019-04-17 ENCOUNTER — Other Ambulatory Visit: Payer: Self-pay

## 2019-04-17 DIAGNOSIS — M25561 Pain in right knee: Secondary | ICD-10-CM

## 2019-04-17 DIAGNOSIS — M1711 Unilateral primary osteoarthritis, right knee: Secondary | ICD-10-CM | POA: Diagnosis not present

## 2019-04-21 DIAGNOSIS — K635 Polyp of colon: Secondary | ICD-10-CM | POA: Diagnosis not present

## 2019-04-21 DIAGNOSIS — Z1211 Encounter for screening for malignant neoplasm of colon: Secondary | ICD-10-CM | POA: Diagnosis not present

## 2019-04-21 DIAGNOSIS — K621 Rectal polyp: Secondary | ICD-10-CM | POA: Diagnosis not present

## 2019-04-21 DIAGNOSIS — D123 Benign neoplasm of transverse colon: Secondary | ICD-10-CM | POA: Diagnosis not present

## 2019-04-21 DIAGNOSIS — D128 Benign neoplasm of rectum: Secondary | ICD-10-CM | POA: Diagnosis not present

## 2019-04-21 DIAGNOSIS — D124 Benign neoplasm of descending colon: Secondary | ICD-10-CM | POA: Diagnosis not present

## 2019-05-22 DIAGNOSIS — Z01419 Encounter for gynecological examination (general) (routine) without abnormal findings: Secondary | ICD-10-CM | POA: Diagnosis not present

## 2019-05-27 ENCOUNTER — Other Ambulatory Visit: Payer: Self-pay | Admitting: Obstetrics and Gynecology

## 2019-05-27 DIAGNOSIS — E785 Hyperlipidemia, unspecified: Secondary | ICD-10-CM | POA: Diagnosis not present

## 2019-05-27 DIAGNOSIS — I10 Essential (primary) hypertension: Secondary | ICD-10-CM | POA: Diagnosis not present

## 2019-05-27 DIAGNOSIS — N6320 Unspecified lump in the left breast, unspecified quadrant: Secondary | ICD-10-CM

## 2019-05-27 DIAGNOSIS — M1711 Unilateral primary osteoarthritis, right knee: Secondary | ICD-10-CM | POA: Diagnosis not present

## 2019-05-27 DIAGNOSIS — Z6837 Body mass index (BMI) 37.0-37.9, adult: Secondary | ICD-10-CM | POA: Diagnosis not present

## 2019-05-27 DIAGNOSIS — M19049 Primary osteoarthritis, unspecified hand: Secondary | ICD-10-CM | POA: Diagnosis not present

## 2019-05-27 DIAGNOSIS — R7303 Prediabetes: Secondary | ICD-10-CM | POA: Diagnosis not present

## 2019-05-29 DIAGNOSIS — M545 Low back pain, unspecified: Secondary | ICD-10-CM | POA: Insufficient documentation

## 2019-05-29 DIAGNOSIS — G8929 Other chronic pain: Secondary | ICD-10-CM | POA: Insufficient documentation

## 2019-06-04 ENCOUNTER — Ambulatory Visit: Payer: No Typology Code available for payment source

## 2019-06-05 ENCOUNTER — Ambulatory Visit
Admission: RE | Admit: 2019-06-05 | Discharge: 2019-06-05 | Disposition: A | Discharge status: Home / Self Care | Payer: 59 | Source: Ambulatory Visit | Attending: Obstetrics and Gynecology | Admitting: Obstetrics and Gynecology

## 2019-06-05 ENCOUNTER — Other Ambulatory Visit: Payer: Self-pay

## 2019-06-05 DIAGNOSIS — N6320 Unspecified lump in the left breast, unspecified quadrant: Secondary | ICD-10-CM

## 2019-06-05 DIAGNOSIS — R928 Other abnormal and inconclusive findings on diagnostic imaging of breast: Secondary | ICD-10-CM | POA: Diagnosis not present

## 2019-06-05 DIAGNOSIS — N6012 Diffuse cystic mastopathy of left breast: Secondary | ICD-10-CM | POA: Diagnosis not present

## 2019-08-08 ENCOUNTER — Encounter: Payer: Self-pay | Admitting: Internal Medicine

## 2019-08-18 MED FILL — LOSARTAN POTASSIUM 50 MG TA: 50 | 30 days supply | Qty: 30 | Fill #0

## 2019-09-04 ENCOUNTER — Encounter (INDEPENDENT_AMBULATORY_CARE_PROVIDER_SITE_OTHER): Payer: Self-pay | Admitting: Bariatrics

## 2019-09-08 ENCOUNTER — Ambulatory Visit (INDEPENDENT_AMBULATORY_CARE_PROVIDER_SITE_OTHER): Payer: 59 | Admitting: Bariatrics

## 2019-09-08 ENCOUNTER — Other Ambulatory Visit: Payer: Self-pay

## 2019-09-08 ENCOUNTER — Encounter (INDEPENDENT_AMBULATORY_CARE_PROVIDER_SITE_OTHER): Payer: Self-pay | Admitting: Bariatrics

## 2019-09-08 VITALS — BP 145/88 | HR 74 | Temp 98.9°F | Ht 63.0 in | Wt 223.0 lb

## 2019-09-08 DIAGNOSIS — Z1331 Encounter for screening for depression: Secondary | ICD-10-CM | POA: Diagnosis not present

## 2019-09-08 DIAGNOSIS — R5383 Other fatigue: Secondary | ICD-10-CM

## 2019-09-08 DIAGNOSIS — G8929 Other chronic pain: Secondary | ICD-10-CM

## 2019-09-08 DIAGNOSIS — M545 Low back pain, unspecified: Secondary | ICD-10-CM

## 2019-09-08 DIAGNOSIS — Z6836 Body mass index (BMI) 36.0-36.9, adult: Secondary | ICD-10-CM

## 2019-09-08 DIAGNOSIS — Z6839 Body mass index (BMI) 39.0-39.9, adult: Secondary | ICD-10-CM

## 2019-09-08 DIAGNOSIS — Z9189 Other specified personal risk factors, not elsewhere classified: Secondary | ICD-10-CM | POA: Diagnosis not present

## 2019-09-08 DIAGNOSIS — I10 Essential (primary) hypertension: Secondary | ICD-10-CM | POA: Diagnosis not present

## 2019-09-08 DIAGNOSIS — R0602 Shortness of breath: Secondary | ICD-10-CM

## 2019-09-08 DIAGNOSIS — E559 Vitamin D deficiency, unspecified: Secondary | ICD-10-CM

## 2019-09-08 DIAGNOSIS — E66812 Obesity, class 2: Secondary | ICD-10-CM | POA: Insufficient documentation

## 2019-09-08 DIAGNOSIS — Z0289 Encounter for other administrative examinations: Secondary | ICD-10-CM

## 2019-09-08 NOTE — Progress Notes (Signed)
Chief Complaint:    OBESITY Anne [name] (MR# [id number]) is a 64 y.o. female who presents for evaluation and treatment of obesity and related comorbidities. Current BMI is Body mass index is 39.5 kg/m.Anne [name] Anne [name] has been struggling with her weight for many years and has been unsuccessful in either losing weight, maintaining weight loss, or reaching her healthy weight goal.  Anne [name] is currently in the action stage of change and ready to dedicate time achieving and maintaining a healthier weight. Anne [name] is interested in becoming our patient and working on intensive lifestyle modifications including (but not limited to) diet and exercise for weight loss.  Anne [name] eats outside the home frequently (50-60%). She states that she is a picky eater. She snacks at night (works night shift).  Anne [name]'s habits were reviewed today and are as follows: Her family eats meals together, her desired weight loss is 58 lbs, she started gaining weight after giving birth to her children, her heaviest weight ever was 223 pounds, she is a picky eater and doesn't like to eat healthier foods, she craves chocolate M&M's, chips (occasionally), meats (stewed), and soups, she snacks frequently in the evenings, she skips lunch daily and dinner occasionally, she is trying to follow a vegetarian diet, she is frequently drinking liquids with calories, she frequently eats larger portions than normal, she has binge eating behaviors and she struggles with emotional eating.  Depression Screen Anne [name]'s Food and Mood (modified PHQ-9) score was 5.  Depression screen PHQ 2/9 09/08/2019  Decreased Interest 2  Down, Depressed, Hopeless 1  PHQ - 2 Score 3  Altered sleeping 0  Tired, decreased energy 1  Change in appetite 1  Feeling bad or failure about yourself  0  Trouble concentrating 0  Moving slowly or fidgety/restless 0  Suicidal thoughts 0  PHQ-9 Score 5  Difficult doing work/chores Not difficult at all   Subjective:    Other fatigue. Tura denies daytime somnolence and denies waking up still tired. [name] works nights 7p to 7a. Snoring is present. Apneic episodes are not present. Epworth Sleepiness Score is 8.  Shortness of breath on exertion. Anne [name] notes increasing shortness of breath with certain activities and seems to be worsening over time with weight gain. She notes getting out of breath sooner with activity than she used to. This has gotten worse recently. Anne [name] denies shortness of breath at rest or orthopnea.  Essential hypertension. Mey is taking Losartan.   BP Readings from Last 3 Encounters:  09/08/19 (!) 145/88  12/27/17 140/90  03/09/12 122/85   No results found for: CREATININE  Chronic low back pain, unspecified back pain laterality, unspecified whether sciatica present. Anne [name] is using Voltaren gel. She has seen a Restaurant manager, fast food.  Vitamin D deficiency. Anne [name] is not on Vitamin D supplementation. She had a low Vitamin D, but took high dose Vitamin D and states repeat test was in the normal range.  Depression screening. Anne [name] had a mildly positive depression screen with a PHQ-9 score of 5.  At risk for osteoporosis. Anne [name] is at higher risk of osteopenia and osteoporosis due to Vitamin D deficiency.   Assessment/Plan:   Other fatigue. Anne [name] does feel that her weight is causing her energy to be lower than it should be. Fatigue may be related to obesity, depression or many other causes. Labs will be ordered, and in the meanwhile, Anne [name] will focus on self care including making healthy food choices, increasing physical activity and focusing on stress reduction. EKG 12-Lead, Comprehensive metabolic  panel, Hemoglobin A1c, Insulin, random ordered.  Shortness of breath on exertion. Anne [name] does feel that she gets out of breath more easily that she used to when she exercises. Anne [name]'s shortness of breath appears to be obesity related and exercise induced. She has agreed to work on weight loss  and gradually increase exercise to treat her exercise induced shortness of breath. Will continue to monitor closely.  Essential hypertension. Anne [name] is working on healthy weight loss and exercise to improve blood pressure control. We will watch for signs of hypotension as she continues her lifestyle modifications. She will continue her medications as directed.  Chronic low back pain, unspecified back pain laterality, unspecified whether sciatica present. Anne [name] will continue her medications as directed.  Vitamin D deficiency. Low Vitamin D level contributes to fatigue and are associated with obesity, breast, and colon cancer. She agrees to begin Vitamin D @ 2,000 IU and D 25 Hydroxy (Vit-D Deficiency, Fractures) level was ordered.  Depression screening. Anne [name] had a positive depression screening. Depression is commonly associated with obesity and often results in emotional eating behaviors. We will monitor this closely and work on CBT to help improve the non-hunger eating patterns. Referral to Psychology may be required if no improvement is seen as she continues in our clinic.  At risk for osteoporosis. Anne [name] was given approximately 15 minutes of osteoporosis prevention counseling today. Anne [name] is at risk for osteopenia and osteoporosis due to her Vitamin D deficiency. She was encouraged to take her Vitamin D and follow her higher calcium diet and increase strengthening exercise to help strengthen her bones and decrease her risk of osteopenia and osteoporosis.  Repetitive spaced learning was employed today to elicit superior memory formation and behavioral change.  Class 2 severe obesity with serious comorbidity and body mass index (BMI) of 39.0 to 39.9 in adult, unspecified obesity type ([place]).  Anne [name] is currently in the action stage of change and her goal is to continue with weight loss efforts. I recommend Anne [name] begin the structured treatment plan as follows:  She has agreed to the Category 1  Plan + 100 calories or Pescatarian minus 100 calories.  She will stop all sugary drinks and work on meal planning.  Exercise goals: All adults should avoid inactivity. Some physical activity is better than none, and adults who participate in any amount of physical activity gain some health benefits.   Behavioral modification strategies: increasing lean protein intake, decreasing simple carbohydrates, increasing vegetables, increasing water intake, decreasing eating out, no skipping meals, meal planning and cooking strategies and keeping healthy foods in the home.  She was informed of the importance of frequent follow-up visits to maximize her success with intensive lifestyle modifications for her multiple health conditions. She was informed we would discuss her lab results at her next visit unless there is a critical issue that needs to be addressed sooner. Mechele agreed to keep her next visit at the agreed upon time to discuss these results.  Objective:   Blood pressure (!) 145/88, pulse 74, temperature 98.9 F (37.2 C), height 5\' 3"  (1.6 m), weight 223 lb (101.2 kg), SpO2 99 %. Body mass index is 39.5 kg/m.  EKG: Normal sinus rhythm, rate 79 BPM.  Indirect Calorimeter completed today shows a VO2 of 214 and a REE of 1483.  Her calculated basal metabolic rate is [id number] thus her basal metabolic rate is worse than expected.  General: Cooperative, alert, well developed, in no acute distress. HEENT: Conjunctivae and lids unremarkable. Cardiovascular: Regular rhythm.  Lungs: Normal work of breathing. Neurologic: No focal deficits.   No results found for: CREATININE, BUN, NA, K, CL, CO2 No results found for: ALT, AST, GGT, ALKPHOS, BILITOT No results found for: HGBA1C No results found for: INSULIN No results found for: TSH No results found for: CHOL, HDL, LDLCALC, LDLDIRECT, TRIG, CHOLHDL No results found for: WBC, HGB, HCT, MCV, PLT No results found for: IRON, TIBC, FERRITIN  Attestation  Statements:   Reviewed by clinician on day of visit: allergies, medications, problem list, medical history, surgical history, family history, social history, and previous encounter notes.  Migdalia Dk, am acting as Location manager for [employer], DO   I have reviewed the above documentation for accuracy and completeness, and I agree with the above. -Jearld Lesch, DO

## 2019-09-09 ENCOUNTER — Encounter (INDEPENDENT_AMBULATORY_CARE_PROVIDER_SITE_OTHER): Payer: Self-pay | Admitting: Bariatrics

## 2019-09-09 DIAGNOSIS — R7303 Prediabetes: Secondary | ICD-10-CM | POA: Insufficient documentation

## 2019-09-09 DIAGNOSIS — E559 Vitamin D deficiency, unspecified: Secondary | ICD-10-CM | POA: Insufficient documentation

## 2019-09-09 LAB — COMPREHENSIVE METABOLIC PANEL
ALT: 16 IU/L (ref 0–32)
AST: 16 IU/L (ref 0–40)
Albumin/Globulin Ratio: 1.4 (ref 1.2–2.2)
Albumin: 4.1 g/dL (ref 3.8–4.8)
Alkaline Phosphatase: 88 IU/L (ref 39–117)
BUN/Creatinine Ratio: 16 (ref 12–28)
BUN: 14 mg/dL (ref 8–27)
Bilirubin Total: 0.4 mg/dL (ref 0.0–1.2)
CO2: 25 mmol/L (ref 20–29)
Calcium: 10 mg/dL (ref 8.7–10.3)
Chloride: 104 mmol/L (ref 96–106)
Creatinine, Ser: 0.85 mg/dL (ref 0.57–1.00)
GFR calc Af Amer: 84 mL/min/{1.73_m2} (ref >59)
GFR calc non Af Amer: 73 mL/min/{1.73_m2} (ref >59)
Globulin, Total: 2.9 g/dL (ref 1.5–4.5)
Glucose: 104 mg/dL — ABNORMAL HIGH (ref 65–99)
Potassium: 4.1 mmol/L (ref 3.5–5.2)
Sodium: 141 mmol/L (ref 134–144)
Total Protein: 7 g/dL (ref 6.0–8.5)

## 2019-09-09 LAB — HEMOGLOBIN A1C
Est. average glucose Bld gHb Est-mCnc: 137 mg/dL
Hgb A1c MFr Bld: 6.4 % — ABNORMAL HIGH (ref 4.8–5.6)

## 2019-09-09 LAB — VITAMIN D 25 HYDROXY (VIT D DEFICIENCY, FRACTURES): Vit D, 25-Hydroxy: 20.6 ng/mL — ABNORMAL LOW (ref 30.0–100.0)

## 2019-09-09 LAB — INSULIN, RANDOM: INSULIN: 11.4 u[IU]/mL (ref 2.6–24.9)

## 2019-09-22 ENCOUNTER — Ambulatory Visit (INDEPENDENT_AMBULATORY_CARE_PROVIDER_SITE_OTHER): Payer: 59 | Admitting: Bariatrics

## 2019-09-22 ENCOUNTER — Encounter (INDEPENDENT_AMBULATORY_CARE_PROVIDER_SITE_OTHER): Payer: Self-pay | Admitting: Bariatrics

## 2019-09-22 ENCOUNTER — Other Ambulatory Visit: Payer: Self-pay

## 2019-09-22 VITALS — BP 153/87 | HR 72 | Temp 98.9°F | Ht 63.0 in | Wt 219.0 lb

## 2019-09-22 DIAGNOSIS — Z9189 Other specified personal risk factors, not elsewhere classified: Secondary | ICD-10-CM

## 2019-09-22 DIAGNOSIS — E559 Vitamin D deficiency, unspecified: Secondary | ICD-10-CM

## 2019-09-22 DIAGNOSIS — R7303 Prediabetes: Secondary | ICD-10-CM | POA: Diagnosis not present

## 2019-09-22 DIAGNOSIS — Z6838 Body mass index (BMI) 38.0-38.9, adult: Secondary | ICD-10-CM

## 2019-09-22 MED ORDER — VITAMIN D (ERGOCALCIFEROL) 1.25 MG (50000 UNIT) PO CAPS: 50000.0000 [IU] | ORAL_CAPSULE | ORAL | 0 refills | Status: DC

## 2019-09-22 NOTE — Progress Notes (Signed)
Chief Complaint:   OBESITY Anne [name] is here to discuss her progress with her obesity treatment plan along with follow-up of her obesity related diagnoses. Rennie is on the Category 1 Plan + 100 calories or the Pescatarian plan minus 100  calories and states she is following her eating plan approximately 100% of the time. [name] states she is walking 30 minutes 6 times per week.  Today's visit was #: 2 Starting weight: 223 lbs Starting date: 09/08/2019 Today's weight: 219 lbs Today's date: 09/22/2019 Total lbs lost to date: 4 Total lbs lost since last in-office visit: 4  Interim History: Anne [name] is down 4 lbs. She did not find the plan to be hard. She is not struggling with cravings at this time.  Subjective:   Vitamin D deficiency. Last Vitamin D 20.6 on 09/08/2019.  Prediabetes. [name] has a diagnosis of prediabetes based on her elevated HgA1c and was informed this puts her at greater risk of developing diabetes. She continues to work on diet and exercise to decrease her risk of diabetes. She denies nausea or hypoglycemia.  Lab Results  Component Value Date   HGBA1C 6.4 (H) 09/08/2019   Lab Results  Component Value Date   INSULIN 11.4 09/08/2019   At risk for diabetes mellitus. Anne [name] is at higher than average risk for developing diabetes due to her obesity.   Assessment/Plan:   Vitamin D deficiency. Low Vitamin D level contributes to fatigue and are associated with obesity, breast, and colon cancer. She was given a prescription for Vitamin D, Ergocalciferol, (DRISDOL) 1.25 MG (50000 UNIT) CAPS capsule every week #4 with 0 refills and will follow-up for routine testing of Vitamin D, at least 2-3 times per year to avoid over-replacement.      Prediabetes. Anne [name] will continue to work on weight loss, exercise, increasing healthy fats and protein, and decreasing simple carbohydrates to help decrease the risk of diabetes.   At risk for diabetes mellitus. Anne [name] was given  approximately 15 minutes of diabetes education and counseling today. We discussed intensive lifestyle modifications today with an emphasis on weight loss as well as increasing exercise and decreasing simple carbohydrates in her diet. We also reviewed medication options with an emphasis on risk versus benefit of those discussed.   Repetitive spaced learning was employed today to elicit superior memory formation and behavioral change.  Class 2 severe obesity with serious comorbidity and body mass index (BMI) of 38.0 to 38.9 in adult, unspecified obesity type ([place]).  Anne [name] is currently in the action stage of change. As such, her goal is to continue with weight loss efforts. She has agreed to the Category 1 Plan + 100 calories or the Pescatarian plan minus 100 calories.   She will work on meal planning and increasing her water intake.  We independently reviewed labs with the patient including Vitamin D, A1c, and insulin.  Exercise goals: Anne [name] will continue walking.  Behavioral modification strategies: increasing lean protein intake, decreasing simple carbohydrates, increasing vegetables, increasing water intake, decreasing liquid calories, decreasing alcohol intake, decreasing sodium intake, increasing high fiber foods, ways to avoid boredom eating, ways to avoid night time snacking, better snacking choices, emotional eating strategies and keeping a strict food journal.  Anne [name] has agreed to follow-up with our clinic in 2 weeks. She was informed of the importance of frequent follow-up visits to maximize her success with intensive lifestyle modifications for her multiple health conditions.   Objective:   Blood pressure (!) 153/87, pulse 72,  temperature 98.9 F (37.2 C), height 5\' 3"  (1.6 m), weight 219 lb (99.3 kg), SpO2 99 %. Body mass index is 38.79 kg/m.  General: Cooperative, alert, well developed, in no acute distress. HEENT: Conjunctivae and lids unremarkable. Cardiovascular: Regular  rhythm.  Lungs: Normal work of breathing. Neurologic: No focal deficits.   Lab Results  Component Value Date   CREATININE 0.85 09/08/2019   BUN 14 09/08/2019   NA 141 09/08/2019   K 4.1 09/08/2019   CL 104 09/08/2019   CO2 25 09/08/2019   Lab Results  Component Value Date   ALT 16 09/08/2019   AST 16 09/08/2019   ALKPHOS 88 09/08/2019   BILITOT 0.4 09/08/2019   Lab Results  Component Value Date   HGBA1C 6.4 (H) 09/08/2019   Lab Results  Component Value Date   INSULIN 11.4 09/08/2019   No results found for: TSH No results found for: CHOL, HDL, LDLCALC, LDLDIRECT, TRIG, CHOLHDL No results found for: WBC, HGB, HCT, MCV, PLT No results found for: IRON, TIBC, FERRITIN  Attestation Statements:   Reviewed by clinician on day of visit: allergies, medications, problem list, medical history, surgical history, family history, social history, and previous encounter notes.  Migdalia Dk, am acting as Location manager for [employer], DO   I have reviewed the above documentation for accuracy and completeness, and I agree with the above. Jearld Lesch, DO

## 2019-09-23 ENCOUNTER — Encounter (INDEPENDENT_AMBULATORY_CARE_PROVIDER_SITE_OTHER): Payer: Self-pay | Admitting: Bariatrics

## 2019-09-25 DIAGNOSIS — I10 Essential (primary) hypertension: Secondary | ICD-10-CM | POA: Diagnosis not present

## 2019-10-07 ENCOUNTER — Other Ambulatory Visit: Payer: Self-pay

## 2019-10-07 ENCOUNTER — Encounter (INDEPENDENT_AMBULATORY_CARE_PROVIDER_SITE_OTHER): Payer: Self-pay | Admitting: Bariatrics

## 2019-10-07 ENCOUNTER — Ambulatory Visit (INDEPENDENT_AMBULATORY_CARE_PROVIDER_SITE_OTHER): Payer: 59 | Admitting: Bariatrics

## 2019-10-07 VITALS — BP 173/86 | HR 94 | Temp 99.7°F | Ht 63.0 in | Wt 219.0 lb

## 2019-10-07 DIAGNOSIS — Z6838 Body mass index (BMI) 38.0-38.9, adult: Secondary | ICD-10-CM

## 2019-10-07 DIAGNOSIS — R7303 Prediabetes: Secondary | ICD-10-CM | POA: Diagnosis not present

## 2019-10-07 DIAGNOSIS — I10 Essential (primary) hypertension: Secondary | ICD-10-CM

## 2019-10-08 ENCOUNTER — Encounter (INDEPENDENT_AMBULATORY_CARE_PROVIDER_SITE_OTHER): Payer: Self-pay | Admitting: Bariatrics

## 2019-10-08 NOTE — Progress Notes (Signed)
Chief Complaint:   OBESITY Anne [name] is here to discuss her progress with her obesity treatment plan along with follow-up of her obesity related diagnoses. Anne [name] is keeping a food journal and adhering to recommended goals of 1100 calories and 70-80 grams of protein and states she is following her eating plan approximately 50% of the time. Anne [name] states she is walking 30-45 minutes 3 times per week.  Today's visit was #: 3 Starting weight: 223 lbs Starting date: 09/08/2019 Today's weight: 219 lbs Today's date: 10/07/2019 Total lbs lost to date: 4 Total lbs lost since last in-office visit: 0  Interim History: Anne [name]'s weight has remained the same. She has had less water and less food. She does not think that this program is helpful for someone who works night shift.  Subjective:   Essential hypertension. Anne [name] is taking Cozaar. Blood pressure is elevated today.  BP Readings from Last 3 Encounters:  10/07/19 (!) 173/86  09/22/19 (!) 153/87  09/08/19 (!) 145/88   Lab Results  Component Value Date   CREATININE 0.85 09/08/2019   Prediabetes. Anne [name] has a diagnosis of prediabetes based on her elevated HgA1c and was informed this puts her at greater risk of developing diabetes. She continues to work on diet and exercise to decrease her risk of diabetes. She denies nausea or hypoglycemia. Anne [name] is on no medications.  Lab Results  Component Value Date   HGBA1C 6.4 (H) 09/08/2019   Lab Results  Component Value Date   INSULIN 11.4 09/08/2019   Assessment/Plan:   Essential hypertension. Anne [name] is working on healthy weight loss and exercise to improve blood pressure control. We will watch for signs of hypotension as she continues her lifestyle modifications. She will continue Cozaar as directed.  Prediabetes. Anne [name] will continue to work on weight loss, increasing activity, and decreasing simple carbohydrates to help decrease the risk of diabetes.   Class 2 severe obesity  with serious comorbidity and body mass index (BMI) of 38.0 to 38.9 in adult, unspecified obesity type ([place]).  Anne [name] is currently in the action stage of change. As such, her goal is to continue with weight loss efforts. She has agreed to the Category 1 Plan + 100 calories and the Pescatarian plan minus 100 calories.  She will work on meal planning and mindful eating.  Exercise goals: All adults should avoid inactivity. Some physical activity is better than none, and adults who participate in any amount of physical activity gain some health benefits.  Behavioral modification strategies: increasing lean protein intake, decreasing simple carbohydrates, increasing vegetables, increasing water intake, decreasing eating out, no skipping meals, meal planning and cooking strategies, keeping healthy foods in the home and planning for success.  Anne [name] will not return at this time. She will return to the office PRN. She was informed of the importance of frequent follow-up visits to maximize her success with intensive lifestyle modifications for her multiple health conditions.   Objective:   Blood pressure (!) 173/86, pulse 94, temperature 99.7 F (37.6 C), height 5\' 3"  (1.6 m), weight 219 lb (99.3 kg), SpO2 99 %. Body mass index is 38.79 kg/m.  General: Cooperative, alert, well developed, in no acute distress. HEENT: Conjunctivae and lids unremarkable. Cardiovascular: Regular rhythm.  Lungs: Normal work of breathing. Neurologic: No focal deficits.   Lab Results  Component Value Date   CREATININE 0.85 09/08/2019   BUN 14 09/08/2019   NA 141 09/08/2019   K 4.1 09/08/2019   CL 104 09/08/2019  CO2 25 09/08/2019   Lab Results  Component Value Date   ALT 16 09/08/2019   AST 16 09/08/2019   ALKPHOS 88 09/08/2019   BILITOT 0.4 09/08/2019   Lab Results  Component Value Date   HGBA1C 6.4 (H) 09/08/2019   Lab Results  Component Value Date   INSULIN 11.4 09/08/2019   No results found for:  TSH No results found for: CHOL, HDL, LDLCALC, LDLDIRECT, TRIG, CHOLHDL No results found for: WBC, HGB, HCT, MCV, PLT No results found for: IRON, TIBC, FERRITIN  Attestation Statements:   Reviewed by clinician on day of visit: allergies, medications, problem list, medical history, surgical history, family history, social history, and previous encounter notes.  Time spent on visit including pre-visit chart review and post-visit charting and care was 20 minutes.   Migdalia Dk, am acting as Location manager for [employer], DO   I have reviewed the above documentation for accuracy and completeness, and I agree with the above. Jearld Lesch, DO

## 2019-12-24 DIAGNOSIS — M9907 Segmental and somatic dysfunction of upper extremity: Secondary | ICD-10-CM | POA: Diagnosis not present

## 2019-12-24 DIAGNOSIS — M9902 Segmental and somatic dysfunction of thoracic region: Secondary | ICD-10-CM | POA: Diagnosis not present

## 2019-12-24 DIAGNOSIS — M9906 Segmental and somatic dysfunction of lower extremity: Secondary | ICD-10-CM | POA: Diagnosis not present

## 2019-12-24 DIAGNOSIS — M5441 Lumbago with sciatica, right side: Secondary | ICD-10-CM | POA: Diagnosis not present

## 2019-12-25 DIAGNOSIS — M9902 Segmental and somatic dysfunction of thoracic region: Secondary | ICD-10-CM | POA: Diagnosis not present

## 2019-12-25 DIAGNOSIS — M9906 Segmental and somatic dysfunction of lower extremity: Secondary | ICD-10-CM | POA: Diagnosis not present

## 2019-12-25 DIAGNOSIS — M5441 Lumbago with sciatica, right side: Secondary | ICD-10-CM | POA: Diagnosis not present

## 2019-12-25 DIAGNOSIS — M9907 Segmental and somatic dysfunction of upper extremity: Secondary | ICD-10-CM | POA: Diagnosis not present

## 2019-12-26 DIAGNOSIS — M9906 Segmental and somatic dysfunction of lower extremity: Secondary | ICD-10-CM | POA: Diagnosis not present

## 2019-12-26 DIAGNOSIS — M9902 Segmental and somatic dysfunction of thoracic region: Secondary | ICD-10-CM | POA: Diagnosis not present

## 2019-12-26 DIAGNOSIS — M9907 Segmental and somatic dysfunction of upper extremity: Secondary | ICD-10-CM | POA: Diagnosis not present

## 2019-12-26 DIAGNOSIS — M5441 Lumbago with sciatica, right side: Secondary | ICD-10-CM | POA: Diagnosis not present

## 2019-12-29 DIAGNOSIS — M9906 Segmental and somatic dysfunction of lower extremity: Secondary | ICD-10-CM | POA: Diagnosis not present

## 2019-12-29 DIAGNOSIS — M9902 Segmental and somatic dysfunction of thoracic region: Secondary | ICD-10-CM | POA: Diagnosis not present

## 2019-12-29 DIAGNOSIS — M5441 Lumbago with sciatica, right side: Secondary | ICD-10-CM | POA: Diagnosis not present

## 2019-12-29 DIAGNOSIS — M9907 Segmental and somatic dysfunction of upper extremity: Secondary | ICD-10-CM | POA: Diagnosis not present

## 2019-12-30 DIAGNOSIS — M9907 Segmental and somatic dysfunction of upper extremity: Secondary | ICD-10-CM | POA: Diagnosis not present

## 2019-12-30 DIAGNOSIS — M9906 Segmental and somatic dysfunction of lower extremity: Secondary | ICD-10-CM | POA: Diagnosis not present

## 2019-12-30 DIAGNOSIS — M5441 Lumbago with sciatica, right side: Secondary | ICD-10-CM | POA: Diagnosis not present

## 2019-12-30 DIAGNOSIS — M9902 Segmental and somatic dysfunction of thoracic region: Secondary | ICD-10-CM | POA: Diagnosis not present

## 2019-12-31 DIAGNOSIS — M5441 Lumbago with sciatica, right side: Secondary | ICD-10-CM | POA: Diagnosis not present

## 2019-12-31 DIAGNOSIS — M9902 Segmental and somatic dysfunction of thoracic region: Secondary | ICD-10-CM | POA: Diagnosis not present

## 2019-12-31 DIAGNOSIS — M9907 Segmental and somatic dysfunction of upper extremity: Secondary | ICD-10-CM | POA: Diagnosis not present

## 2019-12-31 DIAGNOSIS — M9906 Segmental and somatic dysfunction of lower extremity: Secondary | ICD-10-CM | POA: Diagnosis not present

## 2020-01-07 DIAGNOSIS — M9906 Segmental and somatic dysfunction of lower extremity: Secondary | ICD-10-CM | POA: Diagnosis not present

## 2020-01-07 DIAGNOSIS — M9907 Segmental and somatic dysfunction of upper extremity: Secondary | ICD-10-CM | POA: Diagnosis not present

## 2020-01-07 DIAGNOSIS — M5441 Lumbago with sciatica, right side: Secondary | ICD-10-CM | POA: Diagnosis not present

## 2020-01-07 DIAGNOSIS — M9902 Segmental and somatic dysfunction of thoracic region: Secondary | ICD-10-CM | POA: Diagnosis not present

## 2020-01-08 DIAGNOSIS — M9907 Segmental and somatic dysfunction of upper extremity: Secondary | ICD-10-CM | POA: Diagnosis not present

## 2020-01-08 DIAGNOSIS — M9902 Segmental and somatic dysfunction of thoracic region: Secondary | ICD-10-CM | POA: Diagnosis not present

## 2020-01-08 DIAGNOSIS — M9906 Segmental and somatic dysfunction of lower extremity: Secondary | ICD-10-CM | POA: Diagnosis not present

## 2020-01-08 DIAGNOSIS — M5441 Lumbago with sciatica, right side: Secondary | ICD-10-CM | POA: Diagnosis not present

## 2020-01-09 DIAGNOSIS — M5441 Lumbago with sciatica, right side: Secondary | ICD-10-CM | POA: Diagnosis not present

## 2020-01-09 DIAGNOSIS — M9906 Segmental and somatic dysfunction of lower extremity: Secondary | ICD-10-CM | POA: Diagnosis not present

## 2020-01-09 DIAGNOSIS — M9907 Segmental and somatic dysfunction of upper extremity: Secondary | ICD-10-CM | POA: Diagnosis not present

## 2020-01-09 DIAGNOSIS — M9902 Segmental and somatic dysfunction of thoracic region: Secondary | ICD-10-CM | POA: Diagnosis not present

## 2020-01-22 DIAGNOSIS — M9906 Segmental and somatic dysfunction of lower extremity: Secondary | ICD-10-CM | POA: Diagnosis not present

## 2020-01-22 DIAGNOSIS — M9902 Segmental and somatic dysfunction of thoracic region: Secondary | ICD-10-CM | POA: Diagnosis not present

## 2020-01-22 DIAGNOSIS — M5441 Lumbago with sciatica, right side: Secondary | ICD-10-CM | POA: Diagnosis not present

## 2020-01-22 DIAGNOSIS — M9907 Segmental and somatic dysfunction of upper extremity: Secondary | ICD-10-CM | POA: Diagnosis not present

## 2020-01-22 MED FILL — LOSARTAN POTASSIUM 50 MG TA: 50 | 30 days supply | Qty: 30 | Fill #2

## 2020-01-23 DIAGNOSIS — M9907 Segmental and somatic dysfunction of upper extremity: Secondary | ICD-10-CM | POA: Diagnosis not present

## 2020-01-23 DIAGNOSIS — M5441 Lumbago with sciatica, right side: Secondary | ICD-10-CM | POA: Diagnosis not present

## 2020-01-23 DIAGNOSIS — M9902 Segmental and somatic dysfunction of thoracic region: Secondary | ICD-10-CM | POA: Diagnosis not present

## 2020-01-23 DIAGNOSIS — M9906 Segmental and somatic dysfunction of lower extremity: Secondary | ICD-10-CM | POA: Diagnosis not present

## 2020-01-27 DIAGNOSIS — M9906 Segmental and somatic dysfunction of lower extremity: Secondary | ICD-10-CM | POA: Diagnosis not present

## 2020-01-27 DIAGNOSIS — M9907 Segmental and somatic dysfunction of upper extremity: Secondary | ICD-10-CM | POA: Diagnosis not present

## 2020-01-27 DIAGNOSIS — M9902 Segmental and somatic dysfunction of thoracic region: Secondary | ICD-10-CM | POA: Diagnosis not present

## 2020-01-27 DIAGNOSIS — M5441 Lumbago with sciatica, right side: Secondary | ICD-10-CM | POA: Diagnosis not present

## 2020-01-28 DIAGNOSIS — M5441 Lumbago with sciatica, right side: Secondary | ICD-10-CM | POA: Diagnosis not present

## 2020-01-28 DIAGNOSIS — M9907 Segmental and somatic dysfunction of upper extremity: Secondary | ICD-10-CM | POA: Diagnosis not present

## 2020-01-28 DIAGNOSIS — M9906 Segmental and somatic dysfunction of lower extremity: Secondary | ICD-10-CM | POA: Diagnosis not present

## 2020-01-28 DIAGNOSIS — M9902 Segmental and somatic dysfunction of thoracic region: Secondary | ICD-10-CM | POA: Diagnosis not present

## 2020-04-13 DIAGNOSIS — E785 Hyperlipidemia, unspecified: Secondary | ICD-10-CM | POA: Diagnosis not present

## 2020-04-13 DIAGNOSIS — D0000 Carcinoma in situ of oral cavity, unspecified site: Secondary | ICD-10-CM | POA: Diagnosis not present

## 2020-04-16 ENCOUNTER — Other Ambulatory Visit (HOSPITAL_COMMUNITY): Payer: Self-pay | Admitting: Internal Medicine

## 2020-04-16 DIAGNOSIS — E669 Obesity, unspecified: Secondary | ICD-10-CM | POA: Diagnosis not present

## 2020-04-16 DIAGNOSIS — I10 Essential (primary) hypertension: Secondary | ICD-10-CM | POA: Diagnosis not present

## 2020-04-16 DIAGNOSIS — E785 Hyperlipidemia, unspecified: Secondary | ICD-10-CM | POA: Diagnosis not present

## 2020-04-16 DIAGNOSIS — Z0001 Encounter for general adult medical examination with abnormal findings: Secondary | ICD-10-CM | POA: Diagnosis not present

## 2020-04-16 DIAGNOSIS — Z124 Encounter for screening for malignant neoplasm of cervix: Secondary | ICD-10-CM | POA: Diagnosis not present

## 2020-04-16 DIAGNOSIS — R7303 Prediabetes: Secondary | ICD-10-CM | POA: Diagnosis not present

## 2020-04-16 DIAGNOSIS — Z1211 Encounter for screening for malignant neoplasm of colon: Secondary | ICD-10-CM | POA: Diagnosis not present

## 2020-04-16 DIAGNOSIS — Z23 Encounter for immunization: Secondary | ICD-10-CM | POA: Diagnosis not present

## 2020-04-16 DIAGNOSIS — M545 Low back pain, unspecified: Secondary | ICD-10-CM | POA: Diagnosis not present

## 2020-04-16 MED FILL — LOSARTAN POTASSIUM 50 MG TA: 50 | 90 days supply | Qty: 90 | Fill #0

## 2020-04-22 ENCOUNTER — Other Ambulatory Visit (INDEPENDENT_AMBULATORY_CARE_PROVIDER_SITE_OTHER): Payer: Self-pay | Admitting: Bariatrics

## 2020-04-22 DIAGNOSIS — E559 Vitamin D deficiency, unspecified: Secondary | ICD-10-CM

## 2020-05-25 NOTE — Progress Notes (Signed)
Cardiology Office Note:    Date:  05/26/2020   ID:  Anne [name], DOB Jun 19, 1956, MRN [id number]  PCP:  Audley Hose, MD  Cardiologist:  No primary care provider on file.   Referring MD: Audley Hose, MD   Chief Complaint  Patient presents with  . Hypertension  . Hyperlipidemia  . Advice Only    Snoring and elevated A1c    History of Present Illness:    Anne [name] is a 64 y.o. female with a hx of snoring, primary hypertension, hyperlipidemia, prediabetes, overweight, and family history of vascular disease here for cardiovascular disease assessment.  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  She has no specific cardiovascular complaints other than the stress of working in the COVID-19 pandemic for greater than 18 months.  Feels that she is not sleeping as well and is overworked.  She has family history of vascular disease that includes diabetes mellitus type 2 in her sister, her father had congestive heart failure, mother had coronary stents related to myocardial infarction.  The patient has a personal history of elevated hemoglobin A1c of 6.4, elevated blood pressure "only in the doctor's office", eventual initiation of losartan therapy but admits that she does not take it on a regular basis, sleeping only 4 to 5 hours/day, and no idea of her lipid status.  She denies edema, orthopnea, PND.  Has never smoked.  Does not drink alcohol.  Past Medical History:  Diagnosis Date  . Arthritis of knee   . Back pain   . Body mass index (BMI) 30.0-30.9, adult   . High cholesterol   . Joint pain   . Obesity   . Prediabetes     Past Surgical History:  Procedure Laterality Date  . CESAREAN SECTION      Current Medications: No outpatient medications have been marked as taking for the 05/26/20 encounter (Office Visit) with Belva Crome, MD.     Allergies:   Benadryl [diphenhydramine]   Social History   Socioeconomic History  . Marital  status: Single    Spouse name: Not on file  . Number of children: Not on file  . Years of education: Not on file  . Highest education level: Not on file  Occupational History  . Occupation: Nursing  Tobacco Use  . Smoking status: Never Smoker  . Smokeless tobacco: Never Used  Vaping Use  . Vaping Use: Never used  Substance and Sexual Activity  . Alcohol use: Not on file  . Drug use: Not on file  . Sexual activity: Not on file  Other Topics Concern  . Not on file  Social History Narrative  . Not on file   Social Determinants of Health   Financial Resource Strain:   . Difficulty of Paying Living Expenses: Not on file  Food Insecurity:   . Worried About Charity fundraiser in the Last Year: Not on file  . Ran Out of Food in the Last Year: Not on file  Transportation Needs:   . Lack of Transportation (Medical): Not on file  . Lack of Transportation (Non-Medical): Not on file  Physical Activity:   . Days of Exercise per Week: Not on file  . Minutes of Exercise per Session: Not on file  Stress:   . Feeling of Stress : Not on file  Social Connections:   . Frequency of Communication with Friends and Family: Not on file  . Frequency  of Social Gatherings with Friends and Family: Not on file  . Attends Religious Services: Not on file  . Active Member of Clubs or Organizations: Not on file  . Attends Archivist Meetings: Not on file  . Marital Status: Not on file     Family History: The patient's family history includes Alcohol abuse in her brother; Diabetes in her sister; Diverticulitis in her sister; Heart disease in her father and mother; High Cholesterol in her mother; Liver disease in her brother; Obesity in her mother.  ROS:   Please see the history of present illness.    Right knee arthritis is preventing aggressive physical activity.  Feels fatigued.  Snores according to family members.  This has been longstanding.  Does not feel tired during the day.  Awakens  frequently from sleep.  All other systems reviewed and are negative.  EKGs/Labs/Other Studies Reviewed:    The following studies were reviewed today: No cardiac evaluation  EKG:  EKG AV tracing from September 08, 2019, demonstrates normal sinus rhythm, left anterior hemiblock, poor R wave progression.  Recent Labs: 09/08/2019: ALT 16; BUN 14; Creatinine, Ser 0.85; Potassium 4.1; Sodium 141  Recent Lipid Panel No results found for: CHOL, TRIG, HDL, CHOLHDL, VLDL, LDLCALC, LDLDIRECT  Physical Exam:    VS:  BP (!) 160/75   Pulse 73   Temp 97.9 F (36.6 C)   Ht 5\' 4"  (1.626 m)   Wt 221 lb 3.2 oz (100.3 kg)   SpO2 98%   BMI 37.97 kg/m     Wt Readings from Last 3 Encounters:  05/26/20 221 lb 3.2 oz (100.3 kg)  10/07/19 219 lb (99.3 kg)  09/22/19 219 lb (99.3 kg)     GEN: Overweight. No acute distress HEENT: Normal NECK: No JVD. LYMPHATICS: No lymphadenopathy CARDIAC:  RRR without murmur, gallop, but there is trace left ankle edema. VASCULAR:  Normal Pulses. No bruits. RESPIRATORY:  Clear to auscultation without rales, wheezing or rhonchi  ABDOMEN: Soft, non-tender, non-distended, No pulsatile mass, MUSCULOSKELETAL: No deformity  SKIN: Warm and dry NEUROLOGIC:  Alert and oriented x 3 PSYCHIATRIC:  Normal affect   ASSESSMENT:    1. HTN, goal below 130/80   2. Class 2 severe obesity due to excess calories with serious comorbidity and body mass index (BMI) of 36.0 to 36.9 in adult ([place])   3. Prediabetes   4. Snoring   5. Hyperlipidemia with target LDL less than 70   6. Educated about COVID-19 virus infection   7. Family history of early CAD    PLAN:    In order of problems listed above:  1. Her hypertension with poor current control.  Long discussion with the patient concerning "owning" her cardiovascular risk factors including hypertension.  She does not consistently take losartan.  We discussed the "silent killer" nature of hyperlipidemia, hypertension, and diabetes.  We  decided to resume losartan and take on a regular basis.  Continue to monitor blood pressure.  If it remains above 130/80, convert to losartan HCTZ 50/12.5 mg/day.  2300 mg sodium intake per day also discussed.  She does not drink. 2. Aerobic activity discussed, requesting that the patient attempt to achieve 150 minutes of moderate activity per week.  Her right knee is keeping her from being physically active. 3. The hemoglobin A1c was 6.2 in 2020.  No more recent data is available.  No lipid data is available.  We will get a lipid panel, and hemoglobin A1c to help determine cardiovascular  risk currently. 4. Probably needs to be evaluated for sleep apnea.  We began discussing this today.  She is not excited about moving in that direction. 5. Target LDL should be less than 70.  A lipid panel today will help decide if therapy is needed 6. She has been vaccinated and is practicing social distancing.  Overall education and awareness concerning primary/secondary risk prevention was discussed in detail: LDL less than 70, hemoglobin A1c less than 7, blood pressure target less than 130/80 mmHg, >150 minutes of moderate aerobic activity per week, avoidance of smoking, weight control (via diet and exercise), and continued surveillance/management of/for obstructive sleep apnea.  She needs to record her blood pressure at least twice per week.  I will call her to get information about blood pressures and also to discuss the lipid panel when it becomes available.  She will return to see [place] in 2 to 3 months after making dietary and lifestyle changes.  Further discussion concerning primary prevention will ensue at that time.   Medication Adjustments/Labs and Tests Ordered: Current medicines are reviewed at length with the patient today.  Concerns regarding medicines are outlined above.  Orders Placed This Encounter  Procedures  . Basic metabolic panel  . Hepatic function panel  . Lipid panel   Meds ordered this  encounter  Medications  . DISCONTD: losartan-hydrochlorothiazide (HYZAAR) 50-12.5 MG tablet    Sig: Take 1 tablet by mouth daily.    Dispense:  90 tablet    Refill:  3    D/c plain Losartan  . losartan-hydrochlorothiazide (HYZAAR) 50-12.5 MG tablet    Sig: Take 1 tablet by mouth daily.    Dispense:  90 tablet    Refill:  3    D/c plain Losartan    Patient Instructions  Medication Instructions:  1) DISCONTINUE plain Losartan 2) START Losartan/HCTZ 50/12.5mg  once daily  *If you need a refill on your cardiac medications before your next appointment, please call your pharmacy*   Lab Work: BMET, Lipid and Liver in one week.  You will need to be fasting for these labs.   If you have labs (blood work) drawn today and your tests are completely normal, you will receive your results only by: Marland Kitchen MyChart Message (if you have MyChart) OR . A paper copy in the mail If you have any lab test that is abnormal or we need to change your treatment, we will call you to review the results.   Testing/Procedures: None   Follow-Up: At [facility], you and your health needs are our priority.  As part of our continuing mission to provide you with exceptional heart care, we have created designated Provider Care Teams.  These Care Teams include your primary Cardiologist (physician) and Advanced Practice Providers (APPs -  Physician Assistants and Nurse Practitioners) who all work together to provide you with the care you need, when you need it.  We recommend signing up for the patient portal called "MyChart".  Sign up information is provided on this After Visit Summary.  MyChart is used to connect with patients for Virtual Visits (Telemedicine).  Patients are able to view lab/test results, encounter notes, upcoming appointments, etc.  Non-urgent messages can be sent to your provider as well.   To learn more about what you can do with MyChart, go to [email or web address].    Your next appointment:     3 month(s)  The format for your next appointment:   In Person  Provider:   You may see  Dr. Daneen Schick or one of the following Advanced Practice Providers on your designated Care Team:    Truitt Merle, NP  Cecilie Kicks, NP  Kathyrn Drown, NP    Other Instructions      Signed, Sinclair Grooms, MD  05/26/2020 1:47 PM    [place]

## 2020-05-26 ENCOUNTER — Ambulatory Visit (INDEPENDENT_AMBULATORY_CARE_PROVIDER_SITE_OTHER): Payer: 59 | Admitting: Interventional Cardiology

## 2020-05-26 ENCOUNTER — Other Ambulatory Visit: Payer: Self-pay | Admitting: Interventional Cardiology

## 2020-05-26 ENCOUNTER — Other Ambulatory Visit: Payer: Self-pay

## 2020-05-26 ENCOUNTER — Encounter: Payer: Self-pay | Admitting: Interventional Cardiology

## 2020-05-26 VITALS — BP 160/75 | HR 73 | Temp 97.9°F | Ht 64.0 in | Wt 221.2 lb

## 2020-05-26 DIAGNOSIS — I25118 Atherosclerotic heart disease of native coronary artery with other forms of angina pectoris: Secondary | ICD-10-CM

## 2020-05-26 DIAGNOSIS — Z6836 Body mass index (BMI) 36.0-36.9, adult: Secondary | ICD-10-CM

## 2020-05-26 DIAGNOSIS — Z7189 Other specified counseling: Secondary | ICD-10-CM | POA: Diagnosis not present

## 2020-05-26 DIAGNOSIS — R7303 Prediabetes: Secondary | ICD-10-CM

## 2020-05-26 DIAGNOSIS — Z8249 Family history of ischemic heart disease and other diseases of the circulatory system: Secondary | ICD-10-CM

## 2020-05-26 DIAGNOSIS — I10 Essential (primary) hypertension: Secondary | ICD-10-CM | POA: Diagnosis not present

## 2020-05-26 DIAGNOSIS — R0683 Snoring: Secondary | ICD-10-CM

## 2020-05-26 DIAGNOSIS — E785 Hyperlipidemia, unspecified: Secondary | ICD-10-CM

## 2020-05-26 MED ORDER — LOSARTAN POTASSIUM-HCTZ 50-12.5 MG PO TABS: 1.0000 | ORAL_TABLET | Freq: Every day | ORAL | 3 refills | Status: DC

## 2020-05-26 MED FILL — LOSARTAN-HCTZ 50-12.5 MG TA: 50-12.5 | 90 days supply | Qty: 90 | Fill #0

## 2020-05-26 NOTE — Patient Instructions (Signed)
Medication Instructions:  1) DISCONTINUE plain Losartan 2) START Losartan/HCTZ 50/12.5mg  once daily  *If you need a refill on your cardiac medications before your next appointment, please call your pharmacy*   Lab Work: BMET, Lipid and Liver in one week.  You will need to be fasting for these labs.   If you have labs (blood work) drawn today and your tests are completely normal, you will receive your results only by: Marland Kitchen MyChart Message (if you have MyChart) OR . A paper copy in the mail If you have any lab test that is abnormal or we need to change your treatment, we will call you to review the results.   Testing/Procedures: None   Follow-Up: At [facility], you and your health needs are our priority.  As part of our continuing mission to provide you with exceptional heart care, we have created designated Provider Care Teams.  These Care Teams include your primary Cardiologist (physician) and Advanced Practice Providers (APPs -  Physician Assistants and Nurse Practitioners) who all work together to provide you with the care you need, when you need it.  We recommend signing up for the patient portal called "MyChart".  Sign up information is provided on this After Visit Summary.  MyChart is used to connect with patients for Virtual Visits (Telemedicine).  Patients are able to view lab/test results, encounter notes, upcoming appointments, etc.  Non-urgent messages can be sent to your provider as well.   To learn more about what you can do with MyChart, go to [email or web address].    Your next appointment:   3 month(s)  The format for your next appointment:   In Person  Provider:   You may see Dr. Daneen Schick or one of the following Advanced Practice Providers on your designated Care Team:    Truitt Merle, NP  Cecilie Kicks, NP  Kathyrn Drown, NP    Other Instructions

## 2020-05-31 ENCOUNTER — Other Ambulatory Visit: Payer: Self-pay | Admitting: Family Medicine

## 2020-05-31 DIAGNOSIS — Z1231 Encounter for screening mammogram for malignant neoplasm of breast: Secondary | ICD-10-CM

## 2020-06-02 ENCOUNTER — Other Ambulatory Visit: Payer: 59

## 2020-06-02 ENCOUNTER — Other Ambulatory Visit: Payer: Self-pay

## 2020-06-02 DIAGNOSIS — R7303 Prediabetes: Secondary | ICD-10-CM | POA: Diagnosis not present

## 2020-06-02 DIAGNOSIS — I10 Essential (primary) hypertension: Secondary | ICD-10-CM

## 2020-06-02 LAB — BASIC METABOLIC PANEL
BUN/Creatinine Ratio: 14 (ref 12–28)
BUN: 12 mg/dL (ref 8–27)
CO2: 27 mmol/L (ref 20–29)
Calcium: 10.4 mg/dL — ABNORMAL HIGH (ref 8.7–10.3)
Chloride: 99 mmol/L (ref 96–106)
Creatinine, Ser: 0.88 mg/dL (ref 0.57–1.00)
GFR calc Af Amer: 80 mL/min/{1.73_m2} (ref >59)
GFR calc non Af Amer: 70 mL/min/{1.73_m2} (ref >59)
Glucose: 109 mg/dL — ABNORMAL HIGH (ref 65–99)
Potassium: 4.6 mmol/L (ref 3.5–5.2)
Sodium: 138 mmol/L (ref 134–144)

## 2020-06-02 LAB — HEPATIC FUNCTION PANEL
ALT: 16 IU/L (ref 0–32)
AST: 18 IU/L (ref 0–40)
Albumin: 4.4 g/dL (ref 3.8–4.8)
Alkaline Phosphatase: 90 IU/L (ref 44–121)
Bilirubin Total: 0.4 mg/dL (ref 0.0–1.2)
Bilirubin, Direct: 0.11 mg/dL (ref 0.00–0.40)
Total Protein: 7.2 g/dL (ref 6.0–8.5)

## 2020-06-02 LAB — LIPID PANEL
Chol/HDL Ratio: 3.3 ratio (ref 0.0–4.4)
Cholesterol, Total: 214 mg/dL — ABNORMAL HIGH (ref 100–199)
HDL: 64 mg/dL (ref >39)
LDL Chol Calc (NIH): 136 mg/dL — ABNORMAL HIGH (ref 0–99)
Triglycerides: 79 mg/dL (ref 0–149)
VLDL Cholesterol Cal: 14 mg/dL (ref 5–40)

## 2020-06-03 ENCOUNTER — Other Ambulatory Visit: Payer: 59

## 2020-06-11 ENCOUNTER — Other Ambulatory Visit: Payer: Self-pay | Admitting: Interventional Cardiology

## 2020-06-11 ENCOUNTER — Telehealth: Payer: Self-pay | Admitting: *Deleted

## 2020-06-11 DIAGNOSIS — R7303 Prediabetes: Secondary | ICD-10-CM

## 2020-06-11 DIAGNOSIS — E785 Hyperlipidemia, unspecified: Secondary | ICD-10-CM

## 2020-06-11 MED ORDER — ROSUVASTATIN CALCIUM 10 MG PO TABS: 10.0000 mg | ORAL_TABLET | Freq: Every day | ORAL | 3 refills | Status: DC

## 2020-06-11 MED FILL — ROSUVASTATIN CALCIUM 10 MG: 10 | 90 days supply | Qty: 90 | Fill #0

## 2020-06-11 NOTE — Telephone Encounter (Signed)
-----   Message from Belva Crome, MD sent at 06/11/2020 12:47 PM EST ----- Let the patient know the blood sugar that was obtained was mildly elevated.  Mistakenly, the hemoglobin A1c that was ordered did not get done.  Cholesterol is too high, especially with elevated blood pressure and glucose intolerance.  Recommend starting therapy to start decreasing cholesterol burden I would recommend rosuvastatin 10 mg/day with a liver and lipid panel along with hemoglobin A1c in 6 to 8 weeks. A copy will be sent to Caren Macadam, MD

## 2020-06-11 NOTE — Telephone Encounter (Signed)
Spoke with pt and made her aware of results and recommendations.  Pt agreeable to plan.  Pt will have labs drawn 07/26/20.

## 2020-07-12 ENCOUNTER — Other Ambulatory Visit: Payer: Self-pay

## 2020-07-12 ENCOUNTER — Ambulatory Visit
Admission: RE | Admit: 2020-07-12 | Discharge: 2020-07-12 | Disposition: A | Discharge status: Home / Self Care | Payer: 59 | Source: Ambulatory Visit | Attending: Family Medicine | Admitting: Family Medicine

## 2020-07-12 DIAGNOSIS — Z1231 Encounter for screening mammogram for malignant neoplasm of breast: Secondary | ICD-10-CM

## 2020-07-30 DIAGNOSIS — M1711 Unilateral primary osteoarthritis, right knee: Secondary | ICD-10-CM | POA: Diagnosis not present

## 2020-07-30 DIAGNOSIS — E7800 Pure hypercholesterolemia, unspecified: Secondary | ICD-10-CM | POA: Diagnosis not present

## 2020-07-30 DIAGNOSIS — I10 Essential (primary) hypertension: Secondary | ICD-10-CM | POA: Diagnosis not present

## 2020-08-04 ENCOUNTER — Other Ambulatory Visit (HOSPITAL_COMMUNITY): Payer: Self-pay

## 2020-08-04 MED FILL — OMRON 3 SERIES BP MONITOR D: 1 days supply | Qty: 1 | Fill #0

## 2020-08-11 DIAGNOSIS — Z01419 Encounter for gynecological examination (general) (routine) without abnormal findings: Secondary | ICD-10-CM | POA: Diagnosis not present

## 2020-08-11 DIAGNOSIS — N950 Postmenopausal bleeding: Secondary | ICD-10-CM | POA: Diagnosis not present

## 2020-08-11 DIAGNOSIS — I10 Essential (primary) hypertension: Secondary | ICD-10-CM | POA: Diagnosis not present

## 2020-08-16 DIAGNOSIS — E559 Vitamin D deficiency, unspecified: Secondary | ICD-10-CM | POA: Diagnosis not present

## 2020-08-16 DIAGNOSIS — R635 Abnormal weight gain: Secondary | ICD-10-CM | POA: Diagnosis not present

## 2020-08-16 DIAGNOSIS — E0590 Thyrotoxicosis, unspecified without thyrotoxic crisis or storm: Secondary | ICD-10-CM | POA: Diagnosis not present

## 2020-08-16 DIAGNOSIS — E785 Hyperlipidemia, unspecified: Secondary | ICD-10-CM | POA: Diagnosis not present

## 2020-08-16 DIAGNOSIS — R7303 Prediabetes: Secondary | ICD-10-CM | POA: Diagnosis not present

## 2020-08-16 DIAGNOSIS — N951 Menopausal and female climacteric states: Secondary | ICD-10-CM | POA: Diagnosis not present

## 2020-08-18 ENCOUNTER — Ambulatory Visit: Payer: 59 | Admitting: Orthopaedic Surgery

## 2020-08-19 DIAGNOSIS — D649 Anemia, unspecified: Secondary | ICD-10-CM | POA: Diagnosis not present

## 2020-08-19 DIAGNOSIS — E559 Vitamin D deficiency, unspecified: Secondary | ICD-10-CM | POA: Diagnosis not present

## 2020-08-19 DIAGNOSIS — I10 Essential (primary) hypertension: Secondary | ICD-10-CM | POA: Diagnosis not present

## 2020-08-19 DIAGNOSIS — R7303 Prediabetes: Secondary | ICD-10-CM | POA: Diagnosis not present

## 2020-08-19 DIAGNOSIS — Z1339 Encounter for screening examination for other mental health and behavioral disorders: Secondary | ICD-10-CM | POA: Diagnosis not present

## 2020-08-19 DIAGNOSIS — E785 Hyperlipidemia, unspecified: Secondary | ICD-10-CM | POA: Diagnosis not present

## 2020-08-19 DIAGNOSIS — E0590 Thyrotoxicosis, unspecified without thyrotoxic crisis or storm: Secondary | ICD-10-CM | POA: Diagnosis not present

## 2020-08-19 DIAGNOSIS — Z6838 Body mass index (BMI) 38.0-38.9, adult: Secondary | ICD-10-CM | POA: Diagnosis not present

## 2020-08-19 DIAGNOSIS — Z1331 Encounter for screening for depression: Secondary | ICD-10-CM | POA: Diagnosis not present

## 2020-08-24 DIAGNOSIS — Z6838 Body mass index (BMI) 38.0-38.9, adult: Secondary | ICD-10-CM | POA: Diagnosis not present

## 2020-08-24 DIAGNOSIS — R7303 Prediabetes: Secondary | ICD-10-CM | POA: Diagnosis not present

## 2020-08-24 DIAGNOSIS — I10 Essential (primary) hypertension: Secondary | ICD-10-CM | POA: Diagnosis not present

## 2020-08-24 NOTE — Progress Notes (Signed)
Cardiology Office Note:    Date:  08/25/2020   ID:  LYLE LEISNER, DOB 30-Apr-1956, MRN [id number]  PCP:  Caren Macadam, MD  Cardiologist:  Sinclair Grooms, MD   Referring MD: Audley Hose, MD   Chief Complaint  Patient presents with  . Hyperlipidemia  . Hypertension    History of Present Illness:    NAKYA WEYAND is a 65 y.o. female with a hx of snoring, primary hypertension, hyperlipidemia, prediabetes, overweight, and family history of vascular disease here for cardiovascular disease assessment.  This follow-up is to assess her progress with diet and lifestyle changes.   [place] is here for follow-up.  She has a multitude of risk factors including severe hyperlipidemia, primary hypertension, probable sleep apnea, obesity, glucose intolerance, and physical inactivity due to knee disease.  She has no symptoms.  We had a long discussion concerning risk mitigation.  Based upon her 10-year risk score she has greater than 20% risk of vascular event.  I have recommended aggressive risk factor modification which would also include contemplation of sleep study.  Past Medical History:  Diagnosis Date  . Arthritis of knee   . Back pain   . Body mass index (BMI) 30.0-30.9, adult   . High cholesterol   . Joint pain   . Obesity   . Prediabetes     Past Surgical History:  Procedure Laterality Date  . CESAREAN SECTION      Current Medications: Current Meds  Medication Sig  . Acetaminophen 500 MG capsule as needed.  . diclofenac Sodium (VOLTAREN) 1 % GEL Apply topically 4 (four) times daily.  Marland Kitchen ibuprofen (ADVIL) 200 MG tablet as needed.  Marland Kitchen losartan-hydrochlorothiazide (HYZAAR) 100-12.5 MG tablet Take 1 tablet by mouth daily.  . rosuvastatin (CRESTOR) 10 MG tablet Take 1 tablet (10 mg total) by mouth daily.  . vitamin C (ASCORBIC ACID) 500 MG tablet as needed.  . Vitamin D, Ergocalciferol, (DRISDOL) 1.25 MG (50000 UNIT) CAPS capsule Take 1 capsule (50,000 Units total) by  mouth every 7 (seven) days.  . Zinc 100 MG TABS as needed.     Allergies:   Other and Benadryl [diphenhydramine]   Social History   Socioeconomic History  . Marital status: Single    Spouse name: Not on file  . Number of children: Not on file  . Years of education: Not on file  . Highest education level: Not on file  Occupational History  . Occupation: Nursing  Tobacco Use  . Smoking status: Never Smoker  . Smokeless tobacco: Never Used  Vaping Use  . Vaping Use: Never used  Substance and Sexual Activity  . Alcohol use: Not on file  . Drug use: Not on file  . Sexual activity: Not on file  Other Topics Concern  . Not on file  Social History Narrative  . Not on file   Social Determinants of Health   Financial Resource Strain: Not on file  Food Insecurity: Not on file  Transportation Needs: Not on file  Physical Activity: Not on file  Stress: Not on file  Social Connections: Not on file     Family History: The patient's family history includes Alcohol abuse in her brother; Diabetes in her sister; Diverticulitis in her sister; Heart disease in her father and mother; High Cholesterol in her mother; Liver disease in her brother; Obesity in her mother.  ROS:   Please see the history of present illness.    No chest pain.  No  edema.  All other systems reviewed and are negative.  EKGs/Labs/Other Studies Reviewed:    The following studies were reviewed today: No new data  EKG:  EKG none performed.  Last tracing was done in March 2021 revealed left anterior hemiblock with biatrial abnormality.  Recent Labs: 06/02/2020: ALT 16; BUN 12; Creatinine, Ser 0.88; Potassium 4.6; Sodium 138  Recent Lipid Panel    Component Value Date/Time   CHOL 214 (H) 06/02/2020 0853   TRIG 79 06/02/2020 0853   HDL 64 06/02/2020 0853   CHOLHDL 3.3 06/02/2020 0853   LDLCALC 136 (H) 06/02/2020 0853    Physical Exam:    VS:  BP (!) 170/68   Pulse 69   Ht 5\' 4"  (1.626 m)   Wt 224 lb  (101.6 kg)   SpO2 100%   BMI 38.45 kg/m     Wt Readings from Last 3 Encounters:  08/25/20 224 lb (101.6 kg)  05/26/20 221 lb 3.2 oz (100.3 kg)  10/07/19 219 lb (99.3 kg)     GEN: Overweight. No acute distress HEENT: Normal NECK: No JVD. LYMPHATICS: No lymphadenopathy CARDIAC: No murmur. RRR no gallop, or edema. VASCULAR:  Normal Pulses. No bruits. RESPIRATORY:  Clear to auscultation without rales, wheezing or rhonchi  ABDOMEN: Soft, non-tender, non-distended, No pulsatile mass, MUSCULOSKELETAL: No deformity  SKIN: Warm and dry NEUROLOGIC:  Alert and oriented x 3 PSYCHIATRIC:  Normal affect   ASSESSMENT:    1. Hyperlipidemia with target LDL less than 70   2. Prediabetes   3. Snoring   4. HTN, goal below 130/80   5. Class 2 severe obesity due to excess calories with serious comorbidity and body mass index (BMI) of 36.0 to 36.9 in adult [facility])   6. Educated about COVID-19 virus infection    PLAN:    In order of problems listed above:  1. Low intensity rosuvastatin 10 mg daily the LDL is 126.  She needs to be on high intensity at a dose of either 20 or 40 mg.  Before increasing the medication dose, I have recommended she undergo coronary calcium scoring which will help [place] better assess her 10-year vascular risk. 2. A definite risk factor.  Mediterranean diet discussed with decrease carbohydrate intake and more plant-based approach.  A1c target should be less than 6.5. 3. Needs assessment of snoring to rule out sleep apnea. 4. Target 130/80 mmHg.  Is hard to tell if she is taking her medications.  She says she had a last evening, losartan 100/12.5 mg.  Blood pressure is as noted above. 5. She is on a weight loss program, naturopathic approach. 6. Vaccinated and practicing social distancing.  Plan coronary calcium score.  This will be used to further assess the risk of vascular events.  This was discussed with her in detail.  She agrees to proceed.  Clinical follow-up as needed  or in 6 to 12 months   Medication Adjustments/Labs and Tests Ordered: Current medicines are reviewed at length with the patient today.  Concerns regarding medicines are outlined above.  Orders Placed This Encounter  Procedures  . CT CARDIAC SCORING (SELF PAY ONLY)   No orders of the defined types were placed in this encounter.   Patient Instructions  Medication Instructions:  Your physician recommends that you continue on your current medications as directed. Please refer to the Current Medication list given to you today.  *If you need a refill on your cardiac medications before your next appointment, please call your pharmacy*  Lab Work: None If you have labs (blood work) drawn today and your tests are completely normal, you will receive your results only by: Marland Kitchen MyChart Message (if you have MyChart) OR . A paper copy in the mail If you have any lab test that is abnormal or we need to change your treatment, we will call you to review the results.   Testing/Procedures: Your physician recommends that you have a Calcium Score performed.   Follow-Up: At [facility], you and your health needs are our priority.  As part of our continuing mission to provide you with exceptional heart care, we have created designated Provider Care Teams.  These Care Teams include your primary Cardiologist (physician) and Advanced Practice Providers (APPs -  Physician Assistants and Nurse Practitioners) who all work together to provide you with the care you need, when you need it.  We recommend signing up for the patient portal called "MyChart".  Sign up information is provided on this After Visit Summary.  MyChart is used to connect with patients for Virtual Visits (Telemedicine).  Patients are able to view lab/test results, encounter notes, upcoming appointments, etc.  Non-urgent messages can be sent to your provider as well.   To learn more about what you can do with MyChart, go to  [email or web address].    Your next appointment:   6 month(s)-1 year  The format for your next appointment:   In Person  Provider:   You may see Sinclair Grooms, MD or one of the following Advanced Practice Providers on your designated Care Team:    Kathyrn Drown, NP    Other Instructions      Signed, Sinclair Grooms, MD  08/25/2020 11:16 AM    [place]

## 2020-08-25 ENCOUNTER — Other Ambulatory Visit: Payer: Self-pay

## 2020-08-25 ENCOUNTER — Ambulatory Visit (INDEPENDENT_AMBULATORY_CARE_PROVIDER_SITE_OTHER): Payer: 59 | Admitting: Interventional Cardiology

## 2020-08-25 ENCOUNTER — Encounter: Payer: Self-pay | Admitting: Interventional Cardiology

## 2020-08-25 VITALS — BP 170/68 | HR 69 | Ht 64.0 in | Wt 224.0 lb

## 2020-08-25 DIAGNOSIS — Z7189 Other specified counseling: Secondary | ICD-10-CM

## 2020-08-25 DIAGNOSIS — I10 Essential (primary) hypertension: Secondary | ICD-10-CM

## 2020-08-25 DIAGNOSIS — R0683 Snoring: Secondary | ICD-10-CM | POA: Diagnosis not present

## 2020-08-25 DIAGNOSIS — Z6836 Body mass index (BMI) 36.0-36.9, adult: Secondary | ICD-10-CM | POA: Diagnosis not present

## 2020-08-25 DIAGNOSIS — E785 Hyperlipidemia, unspecified: Secondary | ICD-10-CM

## 2020-08-25 DIAGNOSIS — R7303 Prediabetes: Secondary | ICD-10-CM

## 2020-08-25 NOTE — Patient Instructions (Signed)
Medication Instructions:  Your physician recommends that you continue on your current medications as directed. Please refer to the Current Medication list given to you today.  *If you need a refill on your cardiac medications before your next appointment, please call your pharmacy*   Lab Work: None If you have labs (blood work) drawn today and your tests are completely normal, you will receive your results only by: Marland Kitchen MyChart Message (if you have MyChart) OR . A paper copy in the mail If you have any lab test that is abnormal or we need to change your treatment, we will call you to review the results.   Testing/Procedures: Your physician recommends that you have a Calcium Score performed.   Follow-Up: At [facility], you and your health needs are our priority.  As part of our continuing mission to provide you with exceptional heart care, we have created designated Provider Care Teams.  These Care Teams include your primary Cardiologist (physician) and Advanced Practice Providers (APPs -  Physician Assistants and Nurse Practitioners) who all work together to provide you with the care you need, when you need it.  We recommend signing up for the patient portal called "MyChart".  Sign up information is provided on this After Visit Summary.  MyChart is used to connect with patients for Virtual Visits (Telemedicine).  Patients are able to view lab/test results, encounter notes, upcoming appointments, etc.  Non-urgent messages can be sent to your provider as well.   To learn more about what you can do with MyChart, go to [email or web address].    Your next appointment:   6 month(s)-1 year  The format for your next appointment:   In Person  Provider:   You may see Sinclair Grooms, MD or one of the following Advanced Practice Providers on your designated Care Team:    Kathyrn Drown, NP    Other Instructions

## 2020-08-26 ENCOUNTER — Other Ambulatory Visit (HOSPITAL_COMMUNITY): Payer: Self-pay | Admitting: Family Medicine

## 2020-08-26 DIAGNOSIS — I10 Essential (primary) hypertension: Secondary | ICD-10-CM | POA: Diagnosis not present

## 2020-08-26 DIAGNOSIS — R7303 Prediabetes: Secondary | ICD-10-CM | POA: Diagnosis not present

## 2020-08-26 DIAGNOSIS — E559 Vitamin D deficiency, unspecified: Secondary | ICD-10-CM | POA: Diagnosis not present

## 2020-08-26 DIAGNOSIS — E7800 Pure hypercholesterolemia, unspecified: Secondary | ICD-10-CM | POA: Diagnosis not present

## 2020-08-26 MED FILL — VIT D2 1.25 MG (50,000 UNIT: 1.25 MG | 84 days supply | Qty: 12 | Fill #0

## 2020-08-27 ENCOUNTER — Other Ambulatory Visit: Payer: Self-pay

## 2020-08-27 ENCOUNTER — Encounter: Payer: Self-pay | Admitting: Orthopaedic Surgery

## 2020-08-27 ENCOUNTER — Ambulatory Visit (INDEPENDENT_AMBULATORY_CARE_PROVIDER_SITE_OTHER): Payer: 59

## 2020-08-27 ENCOUNTER — Ambulatory Visit (INDEPENDENT_AMBULATORY_CARE_PROVIDER_SITE_OTHER): Payer: 59 | Admitting: Orthopaedic Surgery

## 2020-08-27 VITALS — Ht 62.0 in | Wt 221.0 lb

## 2020-08-27 DIAGNOSIS — M25561 Pain in right knee: Secondary | ICD-10-CM

## 2020-08-27 DIAGNOSIS — G8929 Other chronic pain: Secondary | ICD-10-CM

## 2020-08-27 MED ORDER — BUPIVACAINE HCL 0.25 % IJ SOLN
2.0000 mL | INTRAMUSCULAR | Status: AC | PRN
  Administered 2020-08-27: 2 mL via INTRA_ARTICULAR

## 2020-08-27 MED ORDER — LIDOCAINE HCL 1 % IJ SOLN
2.0000 mL | INTRAMUSCULAR | Status: AC | PRN
  Administered 2020-08-27: 2 mL

## 2020-08-27 MED ORDER — METHYLPREDNISOLONE ACETATE 40 MG/ML IJ SUSP
40.0000 mg | INTRAMUSCULAR | Status: AC | PRN
  Administered 2020-08-27: 40 mg via INTRA_ARTICULAR

## 2020-08-27 NOTE — Progress Notes (Signed)
Office Visit Note   Patient: Anne [name]           Date of Birth: 02-06-1956           MRN: [id number] Visit Date: 08/27/2020              Requested by: Caren Macadam, MD [address] [place],  [place] [id number] PCP: Caren Macadam, MD   Assessment & Plan: Visit Diagnoses:  1. Chronic pain of right knee     Plan: Impression is right knee advanced generative joint disease.  We did discuss various treatment options to include cortisone injection today for which she would like to proceed.  She will follow up with [place] as needed.  Follow-Up Instructions: Return if symptoms worsen or fail to improve.   Orders:  Orders Placed This Encounter  Procedures  . Large Joint Inj: R knee  . XR KNEE 3 VIEW RIGHT   No orders of the defined types were placed in this encounter.     Procedures: Large Joint Inj: R knee on 08/27/2020 9:22 AM Indications: pain Details: 22 G needle, anterolateral approach Medications: 2 mL lidocaine 1 %; 2 mL bupivacaine 0.25 %; 40 mg methylPREDNISolone acetate 40 MG/ML      Clinical Data: No additional findings.   Subjective: No chief complaint on file.   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  She has been dealing with this for the past 2 years without any improvement of symptoms.  The pain she has is described as a constant ache.  No mechanical symptoms.  Pain is worse with walking.  She has been taking Advil and occasional Tylenol with mild relief of symptoms.  No previous cortisone injection.  Review of Systems as detailed in HPI.  All others reviewed and are negative.   Objective: Vital Signs: Ht 5\' 2"  (1.575 m)   Wt 221 lb (100.2 kg)   BMI 40.42 kg/m   Physical Exam well-developed well-nourished female no acute distress.  Alert and oriented x3.  Ortho Exam right knee exam shows a trace effusion.  Range of motion 0 to 115 degrees.  Mild medial joint line tenderness.   Ligaments are stable.  Moderate patellofemoral crepitus.  She is neurovascular intact distally.  Specialty Comments:  No specialty comments available.  Imaging: XR KNEE 3 VIEW RIGHT  Result Date: 08/27/2020 Advanced degenerative changes primarily to the lateral and patellofemoral compartments    PMFS History: Patient Active Problem List   Diagnosis Date Noted  . Prediabetes 09/09/2019  . Vitamin D insufficiency 09/09/2019  . Class 2 severe obesity due to excess calories with serious comorbidity and body mass index (BMI) of 36.0 to 36.9 in adult ([place]) 09/08/2019  . HTN, goal below 130/80 09/08/2019  . Chronic low back pain 05/29/2019   Past Medical History:  Diagnosis Date  . Arthritis of knee   . Back pain   . Body mass index (BMI) 30.0-30.9, adult   . High cholesterol   . Joint pain   . Obesity   . Prediabetes     Family History  Problem Relation Age of Onset  . High Cholesterol Mother   . Heart disease Mother   . Obesity Mother   . Heart disease Father   . Diabetes Sister   . Diverticulitis Sister   . Alcohol abuse Brother   . Liver disease Brother     Past Surgical History:  Procedure  Laterality Date  . CESAREAN SECTION     Social History   Occupational History  . Occupation: Nursing  Tobacco Use  . Smoking status: Never Smoker  . Smokeless tobacco: Never Used  Vaping Use  . Vaping Use: Never used  Substance and Sexual Activity  . Alcohol use: Not on file  . Drug use: Not on file  . Sexual activity: Not on file

## 2020-08-30 DIAGNOSIS — N950 Postmenopausal bleeding: Secondary | ICD-10-CM | POA: Diagnosis not present

## 2020-08-30 DIAGNOSIS — R9389 Abnormal findings on diagnostic imaging of other specified body structures: Secondary | ICD-10-CM | POA: Diagnosis not present

## 2020-08-30 DIAGNOSIS — D251 Intramural leiomyoma of uterus: Secondary | ICD-10-CM | POA: Diagnosis not present

## 2020-08-30 DIAGNOSIS — E559 Vitamin D deficiency, unspecified: Secondary | ICD-10-CM | POA: Diagnosis not present

## 2020-08-30 DIAGNOSIS — R87612 Low grade squamous intraepithelial lesion on cytologic smear of cervix (LGSIL): Secondary | ICD-10-CM | POA: Diagnosis not present

## 2020-08-30 DIAGNOSIS — I10 Essential (primary) hypertension: Secondary | ICD-10-CM | POA: Diagnosis not present

## 2020-09-01 ENCOUNTER — Ambulatory Visit (INDEPENDENT_AMBULATORY_CARE_PROVIDER_SITE_OTHER)
Admission: RE | Admit: 2020-09-01 | Discharge: 2020-09-01 | Disposition: A | Discharge status: Home / Self Care | Payer: Self-pay | Source: Ambulatory Visit | Attending: Interventional Cardiology | Admitting: Interventional Cardiology

## 2020-09-01 ENCOUNTER — Other Ambulatory Visit: Payer: Self-pay

## 2020-09-01 DIAGNOSIS — E785 Hyperlipidemia, unspecified: Secondary | ICD-10-CM

## 2020-09-01 DIAGNOSIS — I10 Essential (primary) hypertension: Secondary | ICD-10-CM | POA: Diagnosis not present

## 2020-09-01 DIAGNOSIS — Z6837 Body mass index (BMI) 37.0-37.9, adult: Secondary | ICD-10-CM | POA: Diagnosis not present

## 2020-09-07 DIAGNOSIS — E559 Vitamin D deficiency, unspecified: Secondary | ICD-10-CM | POA: Diagnosis not present

## 2020-09-07 DIAGNOSIS — Z6837 Body mass index (BMI) 37.0-37.9, adult: Secondary | ICD-10-CM | POA: Diagnosis not present

## 2020-09-10 DIAGNOSIS — N950 Postmenopausal bleeding: Secondary | ICD-10-CM | POA: Diagnosis not present

## 2020-09-10 DIAGNOSIS — N870 Mild cervical dysplasia: Secondary | ICD-10-CM | POA: Diagnosis not present

## 2020-09-10 DIAGNOSIS — R87612 Low grade squamous intraepithelial lesion on cytologic smear of cervix (LGSIL): Secondary | ICD-10-CM | POA: Diagnosis not present

## 2020-10-05 ENCOUNTER — Other Ambulatory Visit: Payer: Self-pay | Admitting: Obstetrics and Gynecology

## 2020-10-18 ENCOUNTER — Other Ambulatory Visit: Payer: Self-pay

## 2020-10-18 ENCOUNTER — Encounter (HOSPITAL_BASED_OUTPATIENT_CLINIC_OR_DEPARTMENT_OTHER): Payer: Self-pay | Admitting: Obstetrics and Gynecology

## 2020-10-18 NOTE — Progress Notes (Signed)
Spoke w/ via phone for pre-op interview--- PT Lab needs dos---- CBC, BMP, EKG              Lab results------ no COVID test ------ 10-21-2020 @ 0820 Arrive at ------- 0930 on 10-22-2020 NPO after MN NO Solid Food.  Clear liquids from MN until--- 0830 Med rec completed Medications to take morning of surgery ----- NONE Diabetic medication ----- Patient instructed to bring photo id and insurance card day of surgery Patient aware to have Driver (ride ) / caregiver    for 24 hours after surgery -- per pt either one of her daughter or a friend Patient Special Instructions ----- pt aware only one person in waiting area and have name/ phone number of ride/ caregiver Pre-Op special Istructions ----- n/a Patient verbalized understanding of instructions that were given at this phone interview. Patient denies shortness of breath, chest pain, fever, cough at this phone interview.

## 2020-10-21 ENCOUNTER — Other Ambulatory Visit (HOSPITAL_COMMUNITY)
Admission: RE | Admit: 2020-10-21 | Discharge: 2020-10-21 | Disposition: A | Discharge status: Home / Self Care | Payer: 59 | Source: Ambulatory Visit | Attending: Obstetrics and Gynecology | Admitting: Obstetrics and Gynecology

## 2020-10-21 DIAGNOSIS — Z01812 Encounter for preprocedural laboratory examination: Secondary | ICD-10-CM | POA: Insufficient documentation

## 2020-10-21 DIAGNOSIS — Z20822 Contact with and (suspected) exposure to covid-19: Secondary | ICD-10-CM | POA: Diagnosis not present

## 2020-10-21 LAB — SARS CORONAVIRUS 2 (TAT 6-24 HRS): SARS Coronavirus 2: NEGATIVE

## 2020-10-22 ENCOUNTER — Ambulatory Visit (HOSPITAL_BASED_OUTPATIENT_CLINIC_OR_DEPARTMENT_OTHER): Payer: 59 | Admitting: Anesthesiology

## 2020-10-22 ENCOUNTER — Other Ambulatory Visit: Payer: Self-pay

## 2020-10-22 ENCOUNTER — Ambulatory Visit (HOSPITAL_BASED_OUTPATIENT_CLINIC_OR_DEPARTMENT_OTHER)
Admission: RE | Admit: 2020-10-22 | Discharge: 2020-10-22 | Disposition: A | Discharge status: Home / Self Care | Payer: 59 | Attending: Obstetrics and Gynecology | Admitting: Obstetrics and Gynecology

## 2020-10-22 ENCOUNTER — Encounter (HOSPITAL_BASED_OUTPATIENT_CLINIC_OR_DEPARTMENT_OTHER): Payer: Self-pay | Admitting: Obstetrics and Gynecology

## 2020-10-22 ENCOUNTER — Encounter (HOSPITAL_BASED_OUTPATIENT_CLINIC_OR_DEPARTMENT_OTHER)
Admission: RE | Disposition: A | Discharge status: Home / Self Care | Payer: Self-pay | Source: Home / Self Care | Attending: Obstetrics and Gynecology

## 2020-10-22 DIAGNOSIS — Z888 Allergy status to other drugs, medicaments and biological substances status: Secondary | ICD-10-CM | POA: Insufficient documentation

## 2020-10-22 DIAGNOSIS — R9389 Abnormal findings on diagnostic imaging of other specified body structures: Secondary | ICD-10-CM | POA: Insufficient documentation

## 2020-10-22 DIAGNOSIS — Z887 Allergy status to serum and vaccine status: Secondary | ICD-10-CM | POA: Diagnosis not present

## 2020-10-22 DIAGNOSIS — N840 Polyp of corpus uteri: Secondary | ICD-10-CM | POA: Insufficient documentation

## 2020-10-22 DIAGNOSIS — E559 Vitamin D deficiency, unspecified: Secondary | ICD-10-CM | POA: Diagnosis not present

## 2020-10-22 DIAGNOSIS — R7303 Prediabetes: Secondary | ICD-10-CM | POA: Diagnosis not present

## 2020-10-22 DIAGNOSIS — N950 Postmenopausal bleeding: Secondary | ICD-10-CM | POA: Diagnosis not present

## 2020-10-22 DIAGNOSIS — Z6841 Body Mass Index (BMI) 40.0 and over, adult: Secondary | ICD-10-CM | POA: Insufficient documentation

## 2020-10-22 DIAGNOSIS — D259 Leiomyoma of uterus, unspecified: Secondary | ICD-10-CM | POA: Diagnosis not present

## 2020-10-22 HISTORY — PX: DILATATION & CURETTAGE/HYSTEROSCOPY WITH MYOSURE: SHX6511

## 2020-10-22 HISTORY — DX: Essential (primary) hypertension: I10

## 2020-10-22 HISTORY — DX: Hyperlipidemia, unspecified: E78.5

## 2020-10-22 HISTORY — DX: Prediabetes: R73.03

## 2020-10-22 HISTORY — DX: Postmenopausal bleeding: N95.0

## 2020-10-22 LAB — CBC
HCT: 44.3 % (ref 36.0–46.0)
Hemoglobin: 13.7 g/dL (ref 12.0–15.0)
MCH: 26.4 pg (ref 26.0–34.0)
MCHC: 30.9 g/dL (ref 30.0–36.0)
MCV: 85.4 fL (ref 80.0–100.0)
Platelets: 274 10*3/uL (ref 150–400)
RBC: 5.19 MIL/uL — ABNORMAL HIGH (ref 3.87–5.11)
RDW: 13.3 % (ref 11.5–15.5)
WBC: 8 10*3/uL (ref 4.0–10.5)
nRBC: 0 % (ref 0.0–0.2)

## 2020-10-22 LAB — BASIC METABOLIC PANEL
Anion gap: 7 (ref 5–15)
BUN: 14 mg/dL (ref 8–23)
CO2: 26 mmol/L (ref 22–32)
Calcium: 9.8 mg/dL (ref 8.9–10.3)
Chloride: 108 mmol/L (ref 98–111)
Creatinine, Ser: 0.82 mg/dL (ref 0.44–1.00)
GFR, Estimated: >60 mL/min (ref >60)
Glucose, Bld: 110 mg/dL — ABNORMAL HIGH (ref 70–99)
Potassium: 3.9 mmol/L (ref 3.5–5.1)
Sodium: 141 mmol/L (ref 135–145)

## 2020-10-22 LAB — TYPE AND SCREEN
ABO/RH(D): B POS
Antibody Screen: NEGATIVE

## 2020-10-22 SURGERY — DILATATION & CURETTAGE/HYSTEROSCOPY WITH MYOSURE
Anesthesia: General | Site: Uterus

## 2020-10-22 MED ORDER — POVIDONE-IODINE 10 % EX SWAB: 2.0000 "application " | Freq: Once | CUTANEOUS | Status: DC

## 2020-10-22 MED ORDER — LIDOCAINE 2% (20 MG/ML) 5 ML SYRINGE
INTRAMUSCULAR | Status: DC | PRN
  Administered 2020-10-22: 100 mg via INTRAVENOUS

## 2020-10-22 MED ORDER — PROPOFOL 10 MG/ML IV BOLUS
INTRAVENOUS | Status: DC | PRN
  Administered 2020-10-22: 180 mg via INTRAVENOUS

## 2020-10-22 MED ORDER — ONDANSETRON HCL 4 MG/2ML IJ SOLN
INTRAMUSCULAR | Status: DC | PRN
  Administered 2020-10-22: 4 mg via INTRAVENOUS

## 2020-10-22 MED ORDER — DEXAMETHASONE SODIUM PHOSPHATE 10 MG/ML IJ SOLN
INTRAMUSCULAR | Status: AC
  Filled 2020-10-22: qty 1

## 2020-10-22 MED ORDER — LIDOCAINE 2% (20 MG/ML) 5 ML SYRINGE
INTRAMUSCULAR | Status: AC
  Filled 2020-10-22: qty 5

## 2020-10-22 MED ORDER — ONDANSETRON HCL 4 MG/2ML IJ SOLN
INTRAMUSCULAR | Status: AC
  Filled 2020-10-22: qty 2

## 2020-10-22 MED ORDER — OXYCODONE HCL 5 MG/5ML PO SOLN: 5.0000 mg | Freq: Once | ORAL | Status: DC | PRN

## 2020-10-22 MED ORDER — FENTANYL CITRATE (PF) 100 MCG/2ML IJ SOLN
INTRAMUSCULAR | Status: AC
  Filled 2020-10-22: qty 2

## 2020-10-22 MED ORDER — SODIUM CHLORIDE 0.9 % IR SOLN
Status: DC | PRN
  Administered 2020-10-22: 3000 mL

## 2020-10-22 MED ORDER — KETOROLAC TROMETHAMINE 30 MG/ML IJ SOLN
INTRAMUSCULAR | Status: AC
  Filled 2020-10-22: qty 1

## 2020-10-22 MED ORDER — MIDAZOLAM HCL 2 MG/2ML IJ SOLN
INTRAMUSCULAR | Status: AC
  Filled 2020-10-22: qty 2

## 2020-10-22 MED ORDER — PROMETHAZINE HCL 25 MG/ML IJ SOLN: 6.2500 mg | INTRAMUSCULAR | Status: DC | PRN

## 2020-10-22 MED ORDER — PHENYLEPHRINE 40 MCG/ML (10ML) SYRINGE FOR IV PUSH (FOR BLOOD PRESSURE SUPPORT)
PREFILLED_SYRINGE | INTRAVENOUS | Status: DC | PRN
  Administered 2020-10-22 (×2): 80 ug via INTRAVENOUS

## 2020-10-22 MED ORDER — LACTATED RINGERS IV SOLN: INTRAVENOUS | Status: DC

## 2020-10-22 MED ORDER — KETOROLAC TROMETHAMINE 30 MG/ML IJ SOLN
INTRAMUSCULAR | Status: DC | PRN
  Administered 2020-10-22: 30 mg via INTRAVENOUS

## 2020-10-22 MED ORDER — PROPOFOL 10 MG/ML IV BOLUS
INTRAVENOUS | Status: AC
  Filled 2020-10-22: qty 20

## 2020-10-22 MED ORDER — FENTANYL CITRATE (PF) 100 MCG/2ML IJ SOLN
INTRAMUSCULAR | Status: DC | PRN
  Administered 2020-10-22: 25 ug via INTRAVENOUS
  Administered 2020-10-22: 50 ug via INTRAVENOUS
  Administered 2020-10-22: 25 ug via INTRAVENOUS

## 2020-10-22 MED ORDER — MIDAZOLAM HCL 2 MG/2ML IJ SOLN
INTRAMUSCULAR | Status: DC | PRN
  Administered 2020-10-22: 2 mg via INTRAVENOUS

## 2020-10-22 MED ORDER — PHENYLEPHRINE 40 MCG/ML (10ML) SYRINGE FOR IV PUSH (FOR BLOOD PRESSURE SUPPORT)
PREFILLED_SYRINGE | INTRAVENOUS | Status: AC
  Filled 2020-10-22: qty 10

## 2020-10-22 MED ORDER — DEXAMETHASONE SODIUM PHOSPHATE 10 MG/ML IJ SOLN
INTRAMUSCULAR | Status: DC | PRN
  Administered 2020-10-22: 10 mg via INTRAVENOUS

## 2020-10-22 MED ORDER — ACETAMINOPHEN 500 MG PO TABS: 1000.0000 mg | ORAL_TABLET | Freq: Once | ORAL | Status: DC

## 2020-10-22 MED ORDER — OXYCODONE HCL 5 MG PO TABS: 5.0000 mg | ORAL_TABLET | Freq: Once | ORAL | Status: DC | PRN

## 2020-10-22 MED ORDER — FENTANYL CITRATE (PF) 100 MCG/2ML IJ SOLN: 25.0000 ug | INTRAMUSCULAR | Status: DC | PRN

## 2020-10-22 SURGICAL SUPPLY — 22 items
CANISTER SUCT 3000ML PPV (MISCELLANEOUS) ×2 IMPLANT
CATH ROBINSON RED A/P 16FR (CATHETERS) IMPLANT
COVER WAND RF STERILE (DRAPES) ×2 IMPLANT
DEVICE MYOSURE LITE (MISCELLANEOUS) IMPLANT
DEVICE MYOSURE REACH (MISCELLANEOUS) ×2 IMPLANT
DILATOR CANAL MILEX (MISCELLANEOUS) IMPLANT
GAUZE 4X4 16PLY RFD (DISPOSABLE) ×2 IMPLANT
GLOVE SURG LTX SZ6.5 (GLOVE) ×2 IMPLANT
GLOVE SURG UNDER POLY LF SZ7 (GLOVE) ×2 IMPLANT
GOWN STRL REUS W/TWL LRG LVL3 (GOWN DISPOSABLE) ×2 IMPLANT
IV NS IRRIG 3000ML ARTHROMATIC (IV SOLUTION) ×2 IMPLANT
KIT PROCEDURE FLUENT (KITS) ×2 IMPLANT
KIT TURNOVER CYSTO (KITS) ×2 IMPLANT
MYOSURE XL FIBROID (MISCELLANEOUS)
PACK VAGINAL MINOR WOMEN LF (CUSTOM PROCEDURE TRAY) ×2 IMPLANT
PAD OB MATERNITY 4.3X12.25 (PERSONAL CARE ITEMS) ×2 IMPLANT
PAD PREP 24X48 CUFFED NSTRL (MISCELLANEOUS) ×2 IMPLANT
SEAL CERVICAL OMNI LOK (ABLATOR) IMPLANT
SEAL ROD LENS SCOPE MYOSURE (ABLATOR) ×2 IMPLANT
SYSTEM TISS REMOVAL MYOSURE XL (MISCELLANEOUS) IMPLANT
TOWEL OR 17X26 10 PK STRL BLUE (TOWEL DISPOSABLE) ×2 IMPLANT
WATER STERILE IRR 500ML POUR (IV SOLUTION) ×2 IMPLANT

## 2020-10-22 NOTE — Transfer of Care (Signed)
  Immediate Anesthesia Transfer of Care Note  Patient: Anne [name]  Procedure(s) Performed: Procedure(s) (LRB): DILATATION & CURETTAGE/HYSTEROSCOPY WITH MYOSURE (N/A)  Patient Location: PACU  Anesthesia Type: General  Level of Consciousness: awake, alert  and oriented  Airway & Oxygen Therapy: Patient Spontanous Breathing and Patient connected to nasal cannula oxygen  Post-op Assessment: Report given to PACU RN and Post -op Vital signs reviewed and stable  Post vital signs: Reviewed and stable  Complications: No apparent anesthesia complications  Last Vitals:  Vitals Value Taken Time  BP 164/80 10/22/20 1212  Temp    Pulse 77 10/22/20 1215  Resp 22 10/22/20 1215  SpO2 96 % 10/22/20 1215  Vitals shown include unvalidated device data.  Last Pain:  Vitals:   10/22/20 1013  TempSrc: Oral  PainSc: 0-No pain         Complications: No complications documented.

## 2020-10-22 NOTE — Anesthesia Preprocedure Evaluation (Addendum)
Anesthesia Evaluation  Patient identified by MRN, date of birth, ID band Patient awake    Reviewed: Allergy & Precautions, NPO status , Patient's Chart, lab work & pertinent test results  History of Anesthesia Complications Negative for: history of anesthetic complications  Airway Mallampati: II  TM Distance: >3 FB Neck ROM: Full    Dental  (+) Missing,    Pulmonary neg pulmonary ROS,    Pulmonary exam normal        Cardiovascular hypertension, Pt. on medications Normal cardiovascular exam     Neuro/Psych negative neurological ROS  negative psych ROS   GI/Hepatic negative GI ROS, Neg liver ROS,   Endo/Other  Morbid obesity  Renal/GU negative Renal ROS  negative genitourinary   Musculoskeletal  (+) Arthritis ,   Abdominal   Peds  Hematology negative hematology ROS (+)   Anesthesia Other Findings Day of surgery medications reviewed with patient.  Reproductive/Obstetrics endometrial polyp, post menopausal bleeding                            Anesthesia Physical Anesthesia Plan  ASA: III  Anesthesia Plan: General   Post-op Pain Management:    Induction: Intravenous  PONV Risk Score and Plan: 3 and Treatment may vary due to age or medical condition, Ondansetron, Dexamethasone and Midazolam  Airway Management Planned: LMA  Additional Equipment: None  Intra-op Plan:   Post-operative Plan: Extubation in OR  Informed Consent: I have reviewed the patients History and Physical, chart, labs and discussed the procedure including the risks, benefits and alternatives for the proposed anesthesia with the patient or authorized representative who has indicated his/her understanding and acceptance.     Dental advisory given  Plan Discussed with: CRNA  Anesthesia Plan Comments:        Anesthesia Quick Evaluation

## 2020-10-22 NOTE — Interval H&P Note (Signed)
History and Physical Interval Note:  10/22/2020 11:08 AM  Anne [name]  has presented today for surgery, with the diagnosis of endometrial polyp, post menopausal bleeding.  The various methods of treatment have been discussed with the patient and family. After consideration of risks, benefits and other options for treatment, the patient has consented to  Procedure(s): [place] (N/A) as a surgical intervention.  The patient's history has been reviewed, patient examined, no change in status, stable for surgery.  I have reviewed the patient's chart and labs.  Questions were answered to the patient's satisfaction.     Theron Cumbie A Jafari Mckillop

## 2020-10-22 NOTE — Discharge Instructions (Signed)
CALL  IF TEMP>100.4, NOTHING PER VAGINA X 2 WK, CALL IF SOAKING A MAXI  PAD EVERY HOUR OR MORE FREQUENTLY     Post Anesthesia Home Care Instructions  Activity: Get plenty of rest for the remainder of the day. A responsible individual must stay with you for 24 hours following the procedure.  For the next 24 hours, DO NOT: -Drive a car -Operate machinery -Drink alcoholic beverages -Take any medication unless instructed by your physician -Make any legal decisions or sign important papers.  Meals: Start with liquid foods such as gelatin or soup. Progress to regular foods as tolerated. Avoid greasy, spicy, heavy foods. If nausea and/or vomiting occur, drink only clear liquids until the nausea and/or vomiting subsides. Call your physician if vomiting continues.  Special Instructions/Symptoms: Your throat may feel dry or sore from the anesthesia or the breathing tube placed in your throat during surgery. If this causes discomfort, gargle with warm salt water. The discomfort should disappear within 24 hours.  If you had a scopolamine patch placed behind your ear for the management of post- operative nausea and/or vomiting:  1. The medication in the patch is effective for 72 hours, after which it should be removed.  Wrap patch in a tissue and discard in the trash. Wash hands thoroughly with soap and water. 2. You may remove the patch earlier than 72 hours if you experience unpleasant side effects which may include dry mouth, dizziness or visual disturbances. 3. Avoid touching the patch. Wash your hands with soap and water after contact with the patch.     

## 2020-10-22 NOTE — Anesthesia Procedure Notes (Signed)
Procedure Name: LMA Insertion Date/Time: 10/22/2020 11:33 AM Performed by: Mechele Claude, CRNA Pre-anesthesia Checklist: Patient identified, Emergency Drugs available, Suction available and Patient being monitored Patient Re-evaluated:Patient Re-evaluated prior to induction Oxygen Delivery Method: Circle system utilized Preoxygenation: Pre-oxygenation with 100% oxygen Induction Type: IV induction Ventilation: Mask ventilation without difficulty LMA: LMA inserted LMA Size: 4.0 Number of attempts: 1 Airway Equipment and Method: Bite block Placement Confirmation: positive ETCO2 Tube secured with: Tape Dental Injury: Teeth and Oropharynx as per pre-operative assessment

## 2020-10-22 NOTE — Op Note (Signed)
[name], [name] MEDICAL RECORD NO: [id number] ACCOUNT NO: [id number] DATE OF BIRTH: 02-04-1956 FACILITY: [facility] LOCATION: [facility] PHYSICIAN: Fortunata Betty A. Garwin Brothers, MD  Operative Report   DATE OF PROCEDURE: 10/22/2020  PREOPERATIVE DIAGNOSES:  Postmenopausal bleeding, endometrial thickening on sonogram, endometrial polyp.  PROCEDURES:  Diagnostic hysteroscopy, hysteroscopic resection of endometrial polyps, dilation and curettage.  POSTOPERATIVE DIAGNOSES:  Postmenopausal bleeding, endometrial polyps, endometrial thickening on sonogram.  ANESTHESIA:  General.  SURGEON:  Mariely Mahr A. Garwin Brothers, MD  ASSISTANT:  None.  DESCRIPTION OF PROCEDURE:  Under adequate general anesthesia, the patient was placed in the dorsal lithotomy position.  She was sterilely prepped and draped in the usual fashion.  The patient voided prior to entering the room, and therefore, she was not  catheterized.  Examination under anesthesia revealed about an 8-week size uterus.  No adnexal masses could be appreciated, but limited by the patient's body habitus.  Bivalve speculum was placed in the vagina.  Atrophic pale vaginal mucosa was noted.   Anterior lip of the cervix was grasped with a single tooth tenaculum.  The cervix easily accepted the #19 dilator.  A diagnostic hysteroscope was introduced into the uterine cavity.  At the entrance into the endocervical canal, a large elongated polypoid  lesion extending into the upper part of the endocervical canal was noted.  On further entering the uterine cavity, left tubal ostia could be seen, the right was not seen, and multiple endometrial polyps including one extending into the endocervical canal and endometrial thickening was also noted.  Using the Reach MyoSure resectoscope, the entire endometrial cavity and polyps were resected.  The endocervical canal was without any lesions on inspection.  When all tissue was resected, all polypoid lesions were removed, all  instruments were then removed  from the vagina.  Specimen labeled endometrial curetting with polyps was sent to pathology.  Fluid deficit was 200 mL.  Complication was none.  The patient tolerated the procedure well and was transferred to recovery room in stable condition.   ROH D: 10/22/2020 12:13:45 pm T: 10/22/2020 6:27:00 pm  JOB: [id number]/ [id number]

## 2020-10-22 NOTE — Brief Op Note (Signed)
10/22/2020  12:10 PM  PATIENT:  Anne [name]  65 y.o. female  PRE-OPERATIVE DIAGNOSIS:  endometrial polyp, post menopausal bleeding, endometrial thickening on sonogram  POST-OPERATIVE DIAGNOSIS:  endometrial polyp, post menopausal bleeding, endometrial thickening on sonogram  PROCEDURE:  Diagnostic hysteroscopy, hysteroscopic resection of endometrial polyps, dilation and curettage  SURGEON:  Surgeon(s) and Role:    Servando Salina, MD - Primary  PHYSICIAN ASSISTANT:   ASSISTANTS: none   ANESTHESIA:   general  Findings: multiple uterine polyps, large endometrial polyp extending into cervical canal, left tubal ostia seen, right not seen, endocervical canal w/o lesion at end of case EBL:  44ml   BLOOD ADMINISTERED:none  DRAINS: none   LOCAL MEDICATIONS USED:  NONE  SPECIMEN:  Source of Specimen:  emc with polyp  DISPOSITION OF SPECIMEN:  PATHOLOGY  COUNTS:  YES  TOURNIQUET:  * No tourniquets in log *  DICTATION: .Other Dictation: Dictation Number [id number]  PLAN OF CARE: Discharge to home after PACU  PATIENT DISPOSITION:  PACU - hemodynamically stable.   Delay start of Pharmacological VTE agent (>24hrs) due to surgical blood loss or risk of bleeding: no

## 2020-10-22 NOTE — Anesthesia Postprocedure Evaluation (Signed)
Anesthesia Post Note  Patient: Anne [name]  Procedure(s) Performed: DILATATION & CURETTAGE/HYSTEROSCOPY WITH MYOSURE (N/A Uterus)     Patient location during evaluation: PACU Anesthesia Type: General Level of consciousness: awake and alert and oriented Pain management: pain level controlled Vital Signs Assessment: post-procedure vital signs reviewed and stable Respiratory status: spontaneous breathing, nonlabored ventilation and respiratory function stable Cardiovascular status: blood pressure returned to baseline Postop Assessment: no apparent nausea or vomiting Anesthetic complications: no   No complications documented.  Last Vitals:  Vitals:   10/22/20 1230 10/22/20 1232  BP:  (!) 165/78  Pulse: (!) 55 (!) 55  Resp: 11 20  Temp:    SpO2: 100% 100%    Last Pain:  Vitals:   10/22/20 1232  TempSrc:   PainSc: 0-No pain                 Brennan Bailey

## 2020-10-22 NOTE — H&P (Signed)
Anne [name] is an 65 y.o. female G1P1 BF presents for planned dx hysteroscopy, D&C, resection of endometrial polyp seen on sonogram done for PMB  Pertinent Gynecological History: Menses: post-menopausal Bleeding: post menopausal bleeding Contraception: none DES exposure: denies Blood transfusions: none Sexually transmitted diseases: no past history Previous GYN Procedures: none  Last mammogram: normal Date: 2022 Last pap: abnormal: LSIL Date: 08/11/2020 OB History: G1P1   Menstrual History: Menarche age: n/a No LMP recorded. Patient is postmenopausal.    Past Medical History:  Diagnosis Date  . Arthritis of knee    right knee  . Hyperlipidemia   . Hypertension    followed by pcp  and cardiology (dr Anne [name]--- coronary CT 09-01-2020  calcium score zero and mild prox LAD calcification)  . PMB (postmenopausal bleeding)   . Pre-diabetes    followed by pcp  . Prediabetes     Past Surgical History:  Procedure Laterality Date  . CESAREAN SECTION  1988  . COLONOSCOPY      Family History  Problem Relation Age of Onset  . High Cholesterol Mother   . Heart disease Mother   . Obesity Mother   . Heart disease Father   . Diabetes Sister   . Diverticulitis Sister   . Alcohol abuse Brother   . Liver disease Brother     Social History:  reports that she has never smoked. She has never used smokeless tobacco. She reports previous alcohol use. She reports that she does not use drugs.  Allergies:  Allergies  Allergen Reactions  . Benadryl [Diphenhydramine]     "becomes extremely drowsiness, can't wake up"  . Tetanus Toxoids Rash    No medications prior to admission.    Review of Systems  All other systems reviewed and are negative.   Height 5' (1.524 m), weight 95.3 kg. Physical Exam Constitutional:      Appearance: Normal appearance. She is obese.  HENT:     Head: Atraumatic.  Eyes:     Extraocular Movements: Extraocular movements intact.  Cardiovascular:      Rate and Rhythm: Regular rhythm.     Heart sounds: Normal heart sounds.  Abdominal:     Palpations: Abdomen is soft.  Genitourinary:    General: Normal vulva.     Comments: Vagina pale mucosa  cervix nulliparous Uterus 10 wk irreg  adnexa no palp mass Musculoskeletal:        General: Normal range of motion.     Cervical back: Neck supple.  Skin:    General: Skin is warm and dry.  Neurological:     General: No focal deficit present.     Mental Status: She is alert and oriented to person, place, and time.  Psychiatric:        Mood and Affect: Mood normal.        Behavior: Behavior normal.     Results for orders placed or performed during the hospital encounter of 10/21/20 (from the past 24 hour(s))  SARS CORONAVIRUS 2 (TAT 6-24 HRS) Nasopharyngeal Nasopharyngeal Swab     Status: None   Collection Time: 10/21/20  8:33 AM   Specimen: Nasopharyngeal Swab  Result Value Ref Range   SARS Coronavirus 2 NEGATIVE NEGATIVE    No results found.  Assessment/Plan: PMB endometrial thickening on sonogram Chronic HTN Fibroid uterus P) dx hysteroscopy , hysteroscopic resection of endometrial polyp using myosure, D&C. Risk of surgery includes infection, bleeding, injury to surrounding organ structures, fluid overload and its mgmt, thermal injury,  uterine perforation ( 07/998) and its risk. All ? answered     Anne [name] A Anne [name] 10/22/2020, 3:41 AM

## 2020-10-25 ENCOUNTER — Encounter (HOSPITAL_BASED_OUTPATIENT_CLINIC_OR_DEPARTMENT_OTHER): Payer: Self-pay | Admitting: Obstetrics and Gynecology

## 2020-10-25 LAB — SURGICAL PATHOLOGY

## 2020-11-01 ENCOUNTER — Other Ambulatory Visit (HOSPITAL_COMMUNITY): Payer: Self-pay

## 2020-11-01 ENCOUNTER — Other Ambulatory Visit: Payer: Self-pay | Admitting: Interventional Cardiology

## 2020-11-02 ENCOUNTER — Other Ambulatory Visit (HOSPITAL_COMMUNITY): Payer: Self-pay

## 2020-11-02 MED ORDER — LOSARTAN POTASSIUM-HCTZ 100-25 MG PO TABS
1.0000 | ORAL_TABLET | Freq: Every day | ORAL | 1 refills | Status: DC
  Filled 2020-11-02 – 2020-11-16 (×2): qty 30, 30d supply, fill #0

## 2020-11-10 ENCOUNTER — Other Ambulatory Visit (HOSPITAL_COMMUNITY): Payer: Self-pay

## 2020-11-16 ENCOUNTER — Other Ambulatory Visit (HOSPITAL_COMMUNITY): Payer: Self-pay

## 2020-12-09 ENCOUNTER — Other Ambulatory Visit (HOSPITAL_COMMUNITY): Payer: Self-pay

## 2020-12-09 DIAGNOSIS — I10 Essential (primary) hypertension: Secondary | ICD-10-CM | POA: Diagnosis not present

## 2020-12-09 DIAGNOSIS — R7303 Prediabetes: Secondary | ICD-10-CM | POA: Diagnosis not present

## 2020-12-09 DIAGNOSIS — R011 Cardiac murmur, unspecified: Secondary | ICD-10-CM | POA: Diagnosis not present

## 2020-12-09 DIAGNOSIS — E7800 Pure hypercholesterolemia, unspecified: Secondary | ICD-10-CM | POA: Diagnosis not present

## 2020-12-09 DIAGNOSIS — E559 Vitamin D deficiency, unspecified: Secondary | ICD-10-CM | POA: Diagnosis not present

## 2020-12-09 MED ORDER — VITAMIN D (ERGOCALCIFEROL) 1.25 MG (50000 UNIT) PO CAPS
1.0000 | ORAL_CAPSULE | ORAL | 0 refills | Status: DC
  Filled 2020-12-09 – 2020-12-17 (×2): qty 12, 84d supply, fill #0

## 2020-12-09 MED ORDER — OLMESARTAN MEDOXOMIL-HCTZ 40-25 MG PO TABS
1.0000 | ORAL_TABLET | Freq: Every day | ORAL | 0 refills | Status: DC
  Filled 2020-12-09 – 2020-12-17 (×2): qty 90, 90d supply, fill #0

## 2020-12-10 ENCOUNTER — Other Ambulatory Visit (HOSPITAL_COMMUNITY): Payer: Self-pay

## 2020-12-10 MED ORDER — ROSUVASTATIN CALCIUM 20 MG PO TABS
20.0000 mg | ORAL_TABLET | Freq: Every evening | ORAL | 1 refills | Status: DC
  Filled 2020-12-10: qty 90, 90d supply, fill #0

## 2020-12-17 ENCOUNTER — Other Ambulatory Visit (HOSPITAL_COMMUNITY): Payer: Self-pay

## 2021-03-15 DIAGNOSIS — R7303 Prediabetes: Secondary | ICD-10-CM | POA: Diagnosis not present

## 2021-03-15 DIAGNOSIS — I10 Essential (primary) hypertension: Secondary | ICD-10-CM | POA: Diagnosis not present

## 2021-03-15 DIAGNOSIS — E559 Vitamin D deficiency, unspecified: Secondary | ICD-10-CM | POA: Diagnosis not present

## 2021-03-15 DIAGNOSIS — E7800 Pure hypercholesterolemia, unspecified: Secondary | ICD-10-CM | POA: Diagnosis not present

## 2021-05-02 NOTE — Progress Notes (Signed)
Cardiology Office Note:    Date:  05/09/2021   ID:  ZAILAH ZAGAMI, DOB [birth year], MRN [id number]  PCP:  Caren Macadam, MD  Cardiologist:  Sinclair Grooms, MD   Referring MD: Caren Macadam, MD   Chief Complaint  Patient presents with   Follow-up    Primary hypertension Hyperlipidemia Prediabetes     History of Present Illness:    Anne [name] is a 65 y.o. female with a hx of snoring, primary hypertension, hyperlipidemia, prediabetes, overweight, and family history of vascular disease here for cardiovascular disease assessment.  This follow-up is to assess her progress with diet and lifestyle changes.   LDL I36, and statin started by PCP. No f/u labs.  Still has a degree of denial concerning her underlying risk factors which include type 2 diabetes, hyperlipidemia, and essential/primary hypertension.  No symptoms.  Reviewed abnormal coronary calcium score.  She states her physician is concerned about a heart murmur.  Past Medical History:  Diagnosis Date   Arthritis of knee    right knee   Hyperlipidemia    Hypertension    followed by pcp  and cardiology (dr h. Tamala Julian--- coronary CT 09-01-2020  calcium score zero and mild prox LAD calcification)   PMB (postmenopausal bleeding)    Pre-diabetes    followed by pcp   Prediabetes     Past Surgical History:  Procedure Laterality Date   [place] N/A 10/22/2020   Procedure: [place];  Surgeon: Servando Salina, MD;  Location: [facility];  Service: Gynecology;  Laterality: N/A;    Current Medications: Current Meds  Medication Sig   Acetaminophen 500 MG capsule as needed.   amLODipine (NORVASC) 5 MG tablet Take 1 tablet (5 mg total) by mouth daily.   Blood Pressure Monitoring (OMRON 3 SERIES BP MONITOR) DEVI USE AS DIRECTED.   diclofenac Sodium (VOLTAREN) 1 % GEL Apply  topically 4 (four) times daily as needed.   folic acid (FOLVITE) 1 MG tablet Take 1 mg by mouth daily.   ibuprofen (ADVIL) 200 MG tablet as needed.   Multiple Vitamin (MULTIVITAMIN) tablet Take 1 tablet by mouth daily.   olmesartan-hydrochlorothiazide (BENICAR HCT) 40-25 MG tablet Take 1 tablet by mouth daily.   rosuvastatin (CRESTOR) 20 MG tablet Take 1 tablet (20 mg total) by mouth every evening.   vitamin C (ASCORBIC ACID) 500 MG tablet as needed.   Vitamin D, Ergocalciferol, (DRISDOL) 1.25 MG (50000 UNIT) CAPS capsule TAKE 1 CAPSULE BY MOUTH ONCE A WEEK.   Zinc 100 MG TABS Take 1 tablet by mouth daily.     Allergies:   Benadryl [diphenhydramine] and Tetanus toxoids   Social History   Socioeconomic History   Marital status: Single    Spouse name: Not on file   Number of children: Not on file   Years of education: Not on file   Highest education level: Not on file  Occupational History   Occupation: Nursing  Tobacco Use   Smoking status: Never   Smokeless tobacco: Never  Vaping Use   Vaping Use: Never used  Substance and Sexual Activity   Alcohol use: Not Currently   Drug use: Never   Sexual activity: Not on file  Other Topics Concern   Not on file  Social History Narrative   Not on file   Social Determinants of Health   Financial Resource Strain: Not  on file  Food Insecurity: Not on file  Transportation Needs: Not on file  Physical Activity: Not on file  Stress: Not on file  Social Connections: Not on file     Family History: The patient's family history includes Alcohol abuse in her brother; Diabetes in her sister; Diverticulitis in her sister; Heart disease in her father and mother; High Cholesterol in her mother; Liver disease in her brother; Obesity in her mother.  ROS:   Please see the history of present illness.    Seems somewhat aggravated today.  All other systems reviewed and are negative.  EKGs/Labs/Other Studies Reviewed:    The following studies  were reviewed today:  CAC Score 08/2020: IMPRESSION: 1. Coronary calcium score of 5.4. This was 41 percentile for age and sex matched control.   Candee Furbish, MD [facility]  EKG:  EKG not performed  Recent Labs: 06/02/2020: ALT 16 10/22/2020: BUN 14; Creatinine, Ser 0.82; Hemoglobin 13.7; Platelets 274; Potassium 3.9; Sodium 141  Recent Lipid Panel    Component Value Date/Time   CHOL 214 (H) 06/02/2020 0853   TRIG 79 06/02/2020 0853   HDL 64 06/02/2020 0853   CHOLHDL 3.3 06/02/2020 0853   LDLCALC 136 (H) 06/02/2020 0853    Physical Exam:    VS:  BP (!) 162/90   Pulse 86   Ht 5' (1.524 m)   Wt 222 lb 9.6 oz (101 kg)   SpO2 100%   BMI 43.47 kg/m     Wt Readings from Last 3 Encounters:  05/09/21 222 lb 9.6 oz (101 kg)  10/22/20 219 lb 8 oz (99.6 kg)  08/27/20 221 lb (100.2 kg)     GEN: Obese. No acute distress HEENT: Normal NECK: No JVD. LYMPHATICS: No lymphadenopathy CARDIAC: No murmur. RRR no gallop, or edema. VASCULAR:  Normal Pulses. No bruits. RESPIRATORY:  Clear to auscultation without rales, wheezing or rhonchi  ABDOMEN: Soft, non-tender, non-distended, No pulsatile mass, MUSCULOSKELETAL: No deformity  SKIN: Warm and dry NEUROLOGIC:  Alert and oriented x 3 PSYCHIATRIC:  Normal affect   ASSESSMENT:    1. HTN, goal below 130/80   2. Hyperlipidemia with target LDL less than 70   3. Prediabetes   4. Agatston CAC score, <100   5. Class 2 severe obesity due to excess calories with serious comorbidity and body mass index (BMI) of 36.0 to 36.9 in adult ([place])   6. Snoring    PLAN:    In order of problems listed above:  Blood pressure is not controlled.  Please see below.  She has decreased olmesartan HCTZ to 20/12.5 mg instead of the ordered 40/25 mg dose.  The medication causes her to have headache.  I asked her to attempt to use 20/12.5 mg twice daily.  In addition we will add amlodipine 5 mg/day.  Target 130/80.  Low-salt diet.  Avoid continuous use of  nonsteroidal therapy. States that her primary physician will follow-up lipids.  Her last LDL was 149.  She is listed as taking 20 mg of rosuvastatin but was not compliant at the time the blood work was done. Consider adding SGLT2 therapy.  Decrease carbohydrate intake.  Increase physical activity. Target LDL should be less than 70. Discussed weight loss. She refuses to do a sleep study.  Target BP: <130/80 mmHg  Diet and lifestyle measures for BP control were reviewed in detail: Low sodium diet (<2.5 gm daily); alcohol restriction (<3 ounces per day); weight loss (Mediterranean); avoid non-steroidal agents; > 6 hours sleep  per day; 150 min moderate exercise per week. Medical regimen will include at least 2 agents. Resistant hypertension if not controlled on 3 agents. Consider further evaluation: Sleep study to r/o OSA; Renal angiogram; Primary hyperaldonism and Pheochromocytoma w/u. After 3 agents, consider MRA (spironolactone)/ Epleronone), hydralazine, beta-blocker, and Minoxidil if not already in use due to patient profile.   Monitor blood pressure at home.  Target less than 130/80 mmHg.   Medication Adjustments/Labs and Tests Ordered: Current medicines are reviewed at length with the patient today.  Concerns regarding medicines are outlined above.  No orders of the defined types were placed in this encounter.  Meds ordered this encounter  Medications   amLODipine (NORVASC) 5 MG tablet    Sig: Take 1 tablet (5 mg total) by mouth daily.    Dispense:  90 tablet    Refill:  3     Patient Instructions  Medication Instructions:  1) START Amlodipine 5mg  once daily  *If you need a refill on your cardiac medications before your next appointment, please call your pharmacy*   Lab Work: Noen If you have labs (blood work) drawn today and your tests are completely normal, you will receive your results only by: [place] (if you have MyChart) OR A paper copy in the mail If you  have any lab test that is abnormal or we need to change your treatment, we will call you to review the results.   Testing/Procedures: Noen   Follow-Up: At [facility], you and your health needs are our priority.  As part of our continuing mission to provide you with exceptional heart care, we have created designated Provider Care Teams.  These Care Teams include your primary Cardiologist (physician) and Advanced Practice Providers (APPs -  Physician Assistants and Nurse Practitioners) who all work together to provide you with the care you need, when you need it.  We recommend signing up for the patient portal called "MyChart".  Sign up information is provided on this After Visit Summary.  MyChart is used to connect with patients for Virtual Visits (Telemedicine).  Patients are able to view lab/test results, encounter notes, upcoming appointments, etc.  Non-urgent messages can be sent to your provider as well.   To learn more about what you can do with MyChart, go to [email or web address].    Your next appointment:   1 year(s)  The format for your next appointment:   In Person  Provider:   Sinclair Grooms, MD {    Other Instructions     Signed, Sinclair Grooms, MD  05/09/2021 8:36 AM    [place]

## 2021-05-09 ENCOUNTER — Other Ambulatory Visit: Payer: Self-pay

## 2021-05-09 ENCOUNTER — Encounter: Payer: Self-pay | Admitting: Interventional Cardiology

## 2021-05-09 ENCOUNTER — Ambulatory Visit (INDEPENDENT_AMBULATORY_CARE_PROVIDER_SITE_OTHER): Payer: 59 | Admitting: Interventional Cardiology

## 2021-05-09 ENCOUNTER — Other Ambulatory Visit (HOSPITAL_COMMUNITY): Payer: Self-pay

## 2021-05-09 VITALS — BP 162/90 | HR 86 | Ht 60.0 in | Wt 222.6 lb

## 2021-05-09 DIAGNOSIS — R0683 Snoring: Secondary | ICD-10-CM | POA: Diagnosis not present

## 2021-05-09 DIAGNOSIS — Z6836 Body mass index (BMI) 36.0-36.9, adult: Secondary | ICD-10-CM

## 2021-05-09 DIAGNOSIS — R7303 Prediabetes: Secondary | ICD-10-CM | POA: Diagnosis not present

## 2021-05-09 DIAGNOSIS — R931 Abnormal findings on diagnostic imaging of heart and coronary circulation: Secondary | ICD-10-CM

## 2021-05-09 DIAGNOSIS — E785 Hyperlipidemia, unspecified: Secondary | ICD-10-CM | POA: Diagnosis not present

## 2021-05-09 DIAGNOSIS — I10 Essential (primary) hypertension: Secondary | ICD-10-CM

## 2021-05-09 MED ORDER — AMLODIPINE BESYLATE 5 MG PO TABS
5.0000 mg | ORAL_TABLET | Freq: Every day | ORAL | 3 refills | Status: DC
  Filled 2021-05-09 – 2022-03-29 (×2): qty 90, 90d supply, fill #0

## 2021-05-09 NOTE — Patient Instructions (Signed)
Medication Instructions:  1) START Amlodipine 5mg  once daily  *If you need a refill on your cardiac medications before your next appointment, please call your pharmacy*   Lab Work: Noen If you have labs (blood work) drawn today and your tests are completely normal, you will receive your results only by: [place] (if you have MyChart) OR A paper copy in the mail If you have any lab test that is abnormal or we need to change your treatment, we will call you to review the results.   Testing/Procedures: Noen   Follow-Up: At [facility], you and your health needs are our priority.  As part of our continuing mission to provide you with exceptional heart care, we have created designated Provider Care Teams.  These Care Teams include your primary Cardiologist (physician) and Advanced Practice Providers (APPs -  Physician Assistants and Nurse Practitioners) who all work together to provide you with the care you need, when you need it.  We recommend signing up for the patient portal called "MyChart".  Sign up information is provided on this After Visit Summary.  MyChart is used to connect with patients for Virtual Visits (Telemedicine).  Patients are able to view lab/test results, encounter notes, upcoming appointments, etc.  Non-urgent messages can be sent to your provider as well.   To learn more about what you can do with MyChart, go to [email or web address].    Your next appointment:   1 year(s)  The format for your next appointment:   In Person  Provider:   Sinclair Grooms, MD {    Other Instructions

## 2021-05-16 DIAGNOSIS — I10 Essential (primary) hypertension: Secondary | ICD-10-CM | POA: Diagnosis not present

## 2021-05-16 DIAGNOSIS — E7800 Pure hypercholesterolemia, unspecified: Secondary | ICD-10-CM | POA: Diagnosis not present

## 2021-05-16 DIAGNOSIS — R7303 Prediabetes: Secondary | ICD-10-CM | POA: Diagnosis not present

## 2021-05-17 ENCOUNTER — Other Ambulatory Visit (HOSPITAL_COMMUNITY): Payer: Self-pay

## 2021-05-18 ENCOUNTER — Other Ambulatory Visit (HOSPITAL_COMMUNITY): Payer: Self-pay

## 2021-05-18 DIAGNOSIS — R7303 Prediabetes: Secondary | ICD-10-CM | POA: Diagnosis not present

## 2021-05-18 DIAGNOSIS — I10 Essential (primary) hypertension: Secondary | ICD-10-CM | POA: Diagnosis not present

## 2021-05-18 DIAGNOSIS — E559 Vitamin D deficiency, unspecified: Secondary | ICD-10-CM | POA: Diagnosis not present

## 2021-05-18 DIAGNOSIS — E7800 Pure hypercholesterolemia, unspecified: Secondary | ICD-10-CM | POA: Diagnosis not present

## 2021-05-18 MED ORDER — AMLODIPINE BESYLATE 5 MG PO TABS
5.0000 mg | ORAL_TABLET | Freq: Every day | ORAL | 3 refills | Status: AC
  Filled 2021-05-18: qty 90, 90d supply, fill #0

## 2021-05-18 MED ORDER — VITAMIN D (ERGOCALCIFEROL) 1.25 MG (50000 UNIT) PO CAPS
50000.0000 [IU] | ORAL_CAPSULE | ORAL | 1 refills | Status: DC
  Filled 2021-05-18: qty 12, 84d supply, fill #0

## 2021-07-12 IMAGING — MG DIGITAL DIAGNOSTIC BILAT W/ TOMO W/ CAD
8 series · 8 of 24 positions shown · non-contrast
Comparison: Previous exam(s).

CLINICAL DATA: Patient presents for palpable left breast mass 10
o'clock position.

EXAM:
DIGITAL DIAGNOSTIC BILATERAL MAMMOGRAM WITH CAD AND TOMO
ULTRASOUND LEFT BREAST

[L MLO synth-2D]
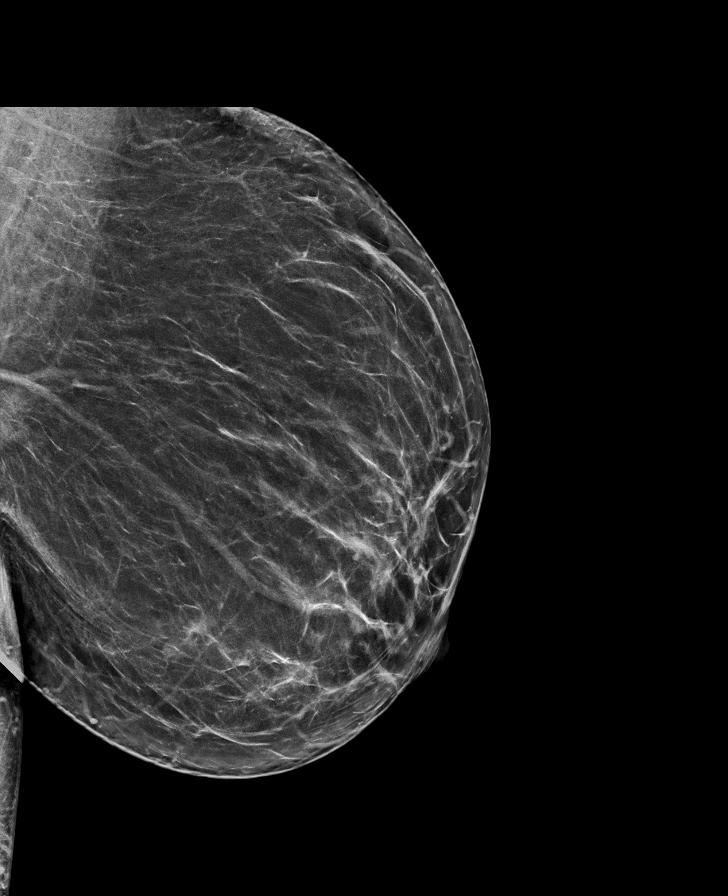

[L CC synth-2D]
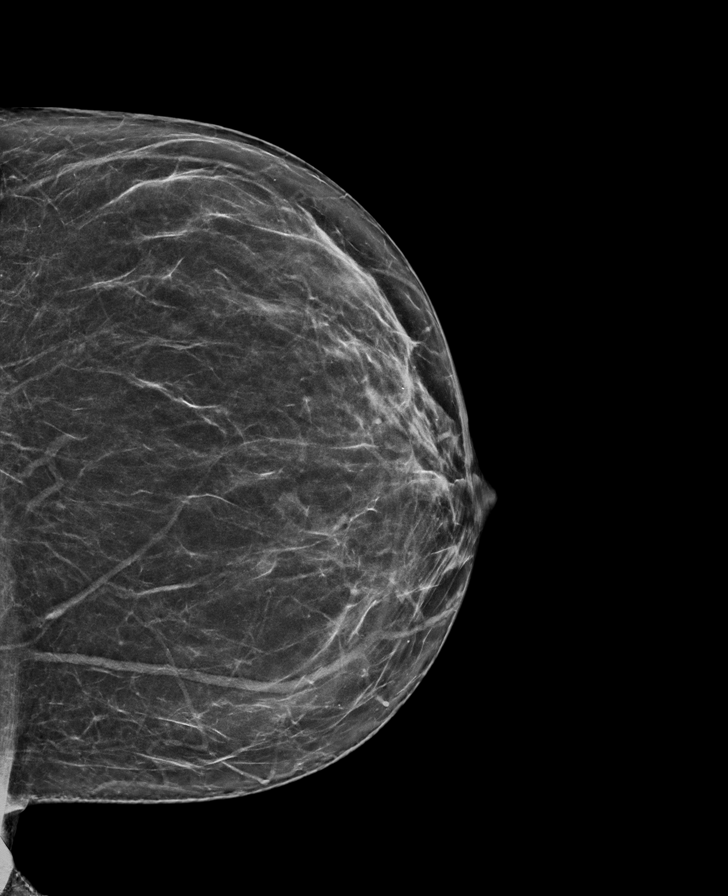

[R CC synth-2D]
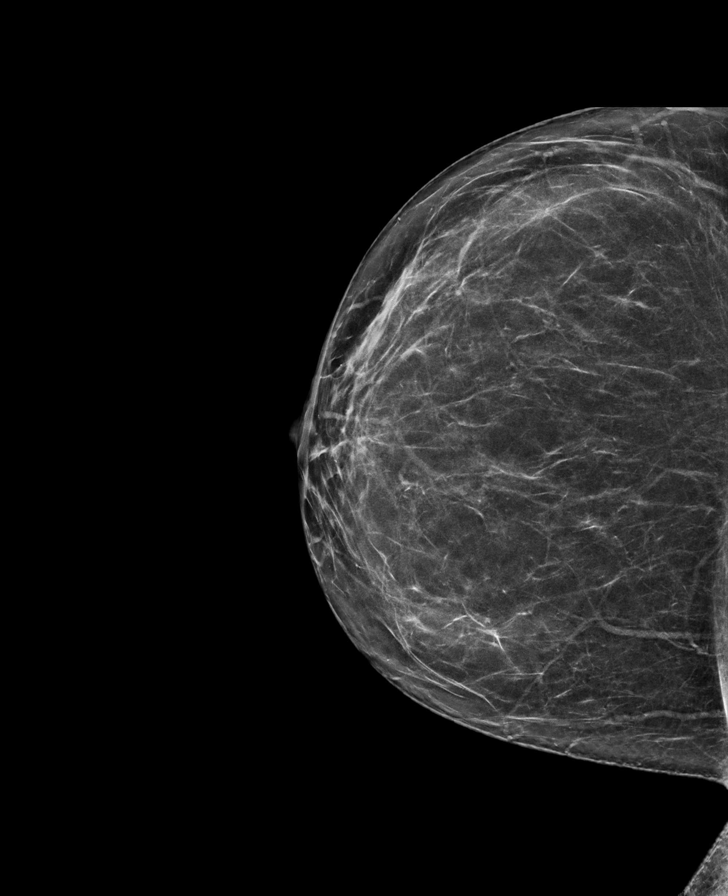

[R MLO synth-2D]
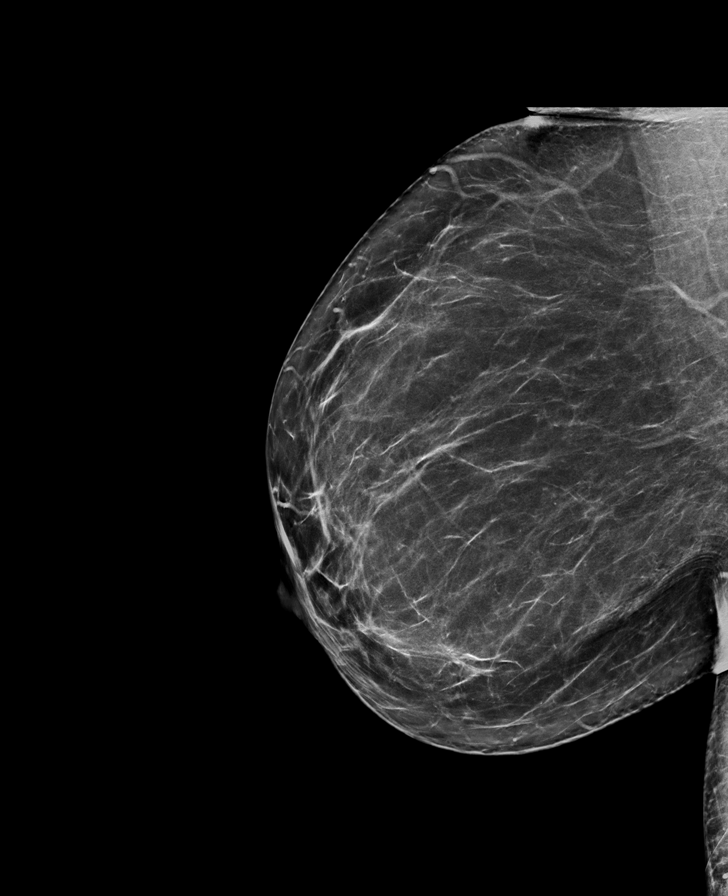

[L CC tomo · tomo slice 36/71.0]
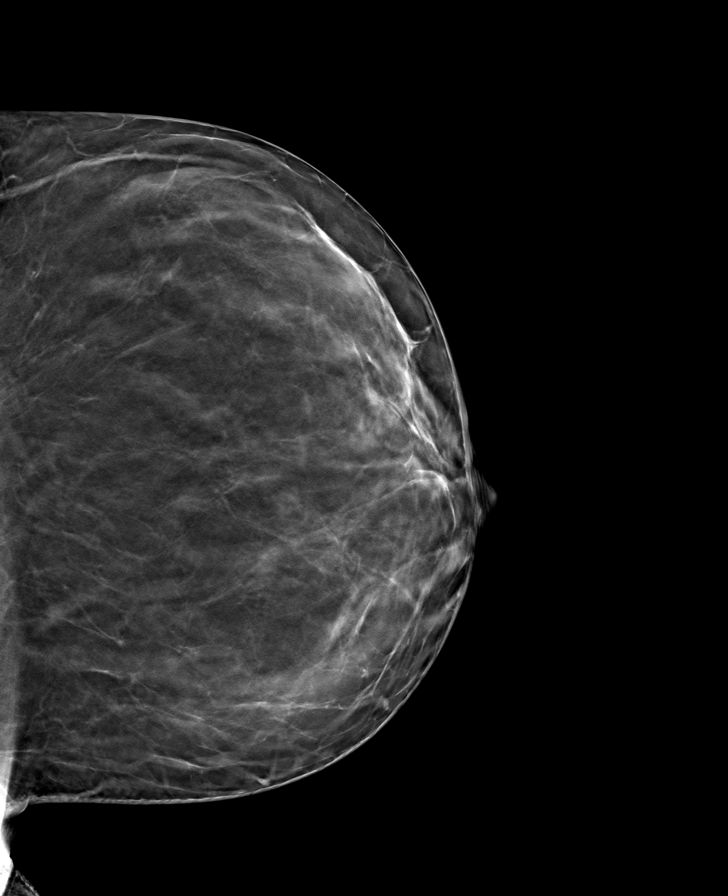

[R MLO tomo · tomo slice 41/81.0]
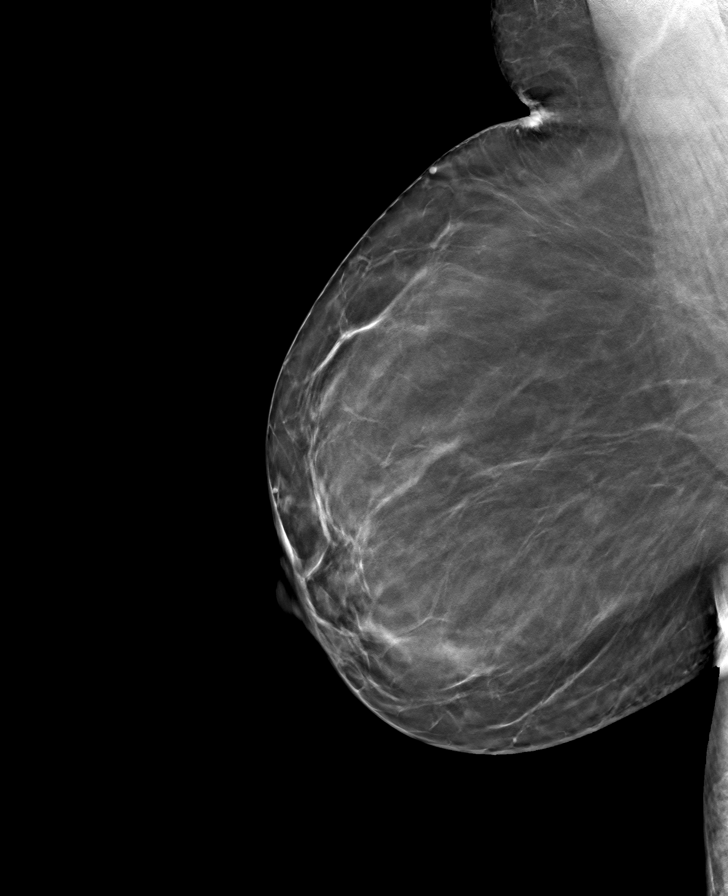

[L MLO tomo · tomo slice 41/82.0]
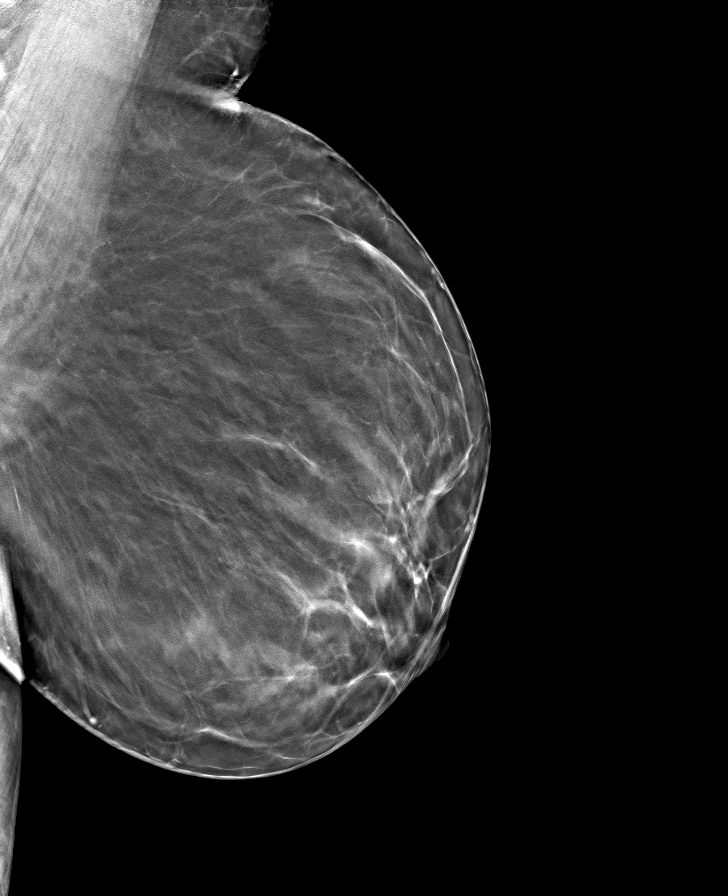

[R CC tomo · tomo slice 36/71.0]
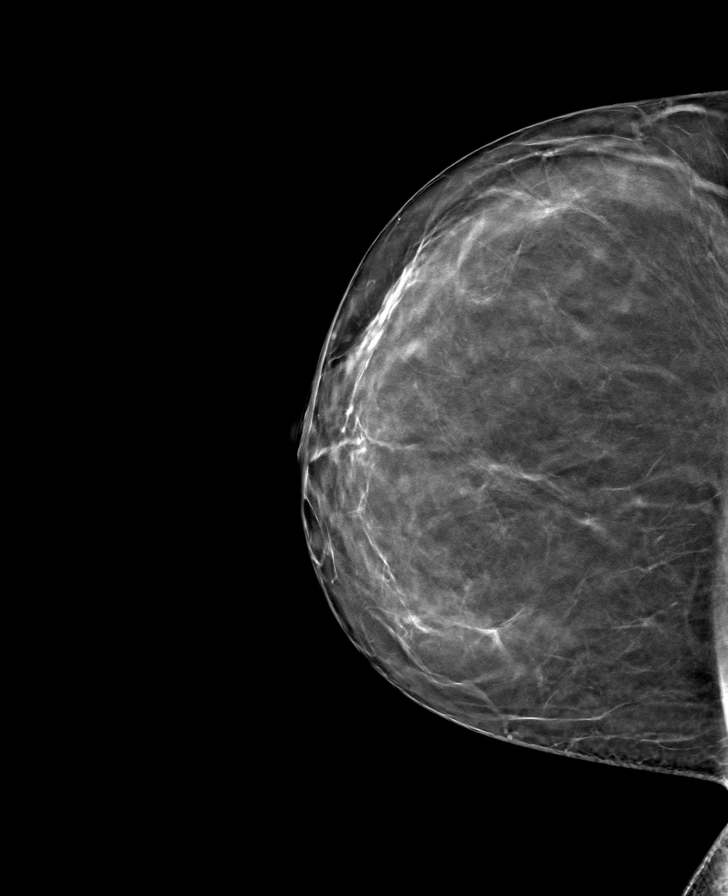

[8 of 24 positions shown; findings below may reference images not displayed]

ACR Breast Density Category b: There are scattered areas of
fibroglandular density.
FINDINGS: There is a small oval circumscribed mass within the lower inner left
breast. No additional concerning masses, calcifications or
nonsurgical distortion identified within the left breast.

Mammographic images were processed with CAD.

Targeted ultrasound is performed, showing no discrete solid or
cystic mass within left breast 9, 10, 11 o'clock position at the
patient reported site of concern.

Within the left breast 6 o'clock position 5 cm from nipple there are
2 adjacent cysts measuring 5 x 4 x 3 mm and 6 x 7 x 3 mm.
IMPRESSION: No mammographic evidence for malignancy.

RECOMMENDATION:
Screening mammogram in one year.(Code:[id number])

Continued clinical evaluation for left breast palpable concern.

I have discussed the findings and recommendations with the patient.
If applicable, a reminder letter will be sent to the patient
regarding the next appointment.

BI-RADS CATEGORY  2: Benign.

## 2021-07-13 ENCOUNTER — Other Ambulatory Visit: Payer: Self-pay | Admitting: Family Medicine

## 2021-07-13 DIAGNOSIS — Z1231 Encounter for screening mammogram for malignant neoplasm of breast: Secondary | ICD-10-CM

## 2021-07-27 ENCOUNTER — Ambulatory Visit
Admission: RE | Admit: 2021-07-27 | Discharge: 2021-07-27 | Disposition: A | Discharge status: Home / Self Care | Payer: 59 | Source: Ambulatory Visit | Attending: Family Medicine | Admitting: Family Medicine

## 2021-07-27 DIAGNOSIS — Z1231 Encounter for screening mammogram for malignant neoplasm of breast: Secondary | ICD-10-CM

## 2022-02-08 ENCOUNTER — Encounter (INDEPENDENT_AMBULATORY_CARE_PROVIDER_SITE_OTHER): Payer: Self-pay

## 2022-02-21 DIAGNOSIS — E7800 Pure hypercholesterolemia, unspecified: Secondary | ICD-10-CM | POA: Diagnosis not present

## 2022-02-21 DIAGNOSIS — E559 Vitamin D deficiency, unspecified: Secondary | ICD-10-CM | POA: Diagnosis not present

## 2022-02-21 DIAGNOSIS — R7303 Prediabetes: Secondary | ICD-10-CM | POA: Diagnosis not present

## 2022-02-21 DIAGNOSIS — Z0000 Encounter for general adult medical examination without abnormal findings: Secondary | ICD-10-CM | POA: Diagnosis not present

## 2022-02-21 DIAGNOSIS — I10 Essential (primary) hypertension: Secondary | ICD-10-CM | POA: Diagnosis not present

## 2022-03-23 DIAGNOSIS — Z1211 Encounter for screening for malignant neoplasm of colon: Secondary | ICD-10-CM | POA: Diagnosis not present

## 2022-03-23 DIAGNOSIS — K5730 Diverticulosis of large intestine without perforation or abscess without bleeding: Secondary | ICD-10-CM | POA: Diagnosis not present

## 2022-03-23 DIAGNOSIS — Z86010 Personal history of colonic polyps: Secondary | ICD-10-CM | POA: Diagnosis not present

## 2022-03-29 ENCOUNTER — Other Ambulatory Visit (HOSPITAL_COMMUNITY): Payer: Self-pay

## 2022-04-03 ENCOUNTER — Other Ambulatory Visit (HOSPITAL_COMMUNITY): Payer: Self-pay

## 2022-04-07 ENCOUNTER — Other Ambulatory Visit (HOSPITAL_COMMUNITY): Payer: Self-pay

## 2022-05-29 ENCOUNTER — Other Ambulatory Visit (HOSPITAL_COMMUNITY): Payer: Self-pay

## 2022-05-29 MED ORDER — AMOXICILLIN 500 MG PO CAPS
500.0000 mg | ORAL_CAPSULE | Freq: Four times a day (QID) | ORAL | 0 refills | Status: DC
  Filled 2022-05-29: qty 28, 7d supply, fill #0

## 2022-05-30 ENCOUNTER — Ambulatory Visit: Payer: 59 | Attending: Nurse Practitioner | Admitting: Nurse Practitioner

## 2022-05-30 ENCOUNTER — Encounter: Payer: Self-pay | Admitting: Nurse Practitioner

## 2022-05-30 VITALS — BP 150/80 | HR 98 | Ht 65.0 in | Wt 215.8 lb

## 2022-05-30 DIAGNOSIS — E785 Hyperlipidemia, unspecified: Secondary | ICD-10-CM

## 2022-05-30 DIAGNOSIS — R931 Abnormal findings on diagnostic imaging of heart and coronary circulation: Secondary | ICD-10-CM | POA: Diagnosis not present

## 2022-05-30 DIAGNOSIS — I10 Essential (primary) hypertension: Secondary | ICD-10-CM

## 2022-05-30 DIAGNOSIS — Z79899 Other long term (current) drug therapy: Secondary | ICD-10-CM | POA: Diagnosis not present

## 2022-05-30 LAB — COMPREHENSIVE METABOLIC PANEL
ALT: 14 IU/L (ref 0–32)
AST: 14 IU/L (ref 0–40)
Albumin/Globulin Ratio: 1.5 (ref 1.2–2.2)
Albumin: 4.1 g/dL (ref 3.9–4.9)
Alkaline Phosphatase: 90 IU/L (ref 44–121)
BUN/Creatinine Ratio: 10 — ABNORMAL LOW (ref 12–28)
BUN: 9 mg/dL (ref 8–27)
Bilirubin Total: 0.3 mg/dL (ref 0.0–1.2)
CO2: 24 mmol/L (ref 20–29)
Calcium: 9.9 mg/dL (ref 8.7–10.3)
Chloride: 102 mmol/L (ref 96–106)
Creatinine, Ser: 0.87 mg/dL (ref 0.57–1.00)
Globulin, Total: 2.8 g/dL (ref 1.5–4.5)
Glucose: 104 mg/dL — ABNORMAL HIGH (ref 70–99)
Potassium: 4 mmol/L (ref 3.5–5.2)
Sodium: 141 mmol/L (ref 134–144)
Total Protein: 6.9 g/dL (ref 6.0–8.5)
eGFR: 73 mL/min/{1.73_m2} (ref >59)

## 2022-05-30 LAB — LIPID PANEL
Chol/HDL Ratio: 3.5 ratio (ref 0.0–4.4)
Cholesterol, Total: 235 mg/dL — ABNORMAL HIGH (ref 100–199)
HDL: 67 mg/dL (ref >39)
LDL Chol Calc (NIH): 152 mg/dL — ABNORMAL HIGH (ref 0–99)
Triglycerides: 90 mg/dL (ref 0–149)
VLDL Cholesterol Cal: 16 mg/dL (ref 5–40)

## 2022-05-30 NOTE — Progress Notes (Signed)
Cardiology Office Note:    Date:  05/30/2022   ID:  Anne [name], DOB 04-Aug-1955, MRN [id number]  PCP:  Caren Macadam, MD   [facility] Providers Cardiologist:  Sinclair Grooms, MD     Referring MD: Caren Macadam, MD   Chief Complaint: annual follow-up CAD, hypertension  History of Present Illness:    Anne [name] is a very pleasant  66 y.o. female with a hx of hypertension, HLD, prediabetes, obesity, snoring, and family history of cardiovascular disease.   She established care with cardiology and was seen by Dr. Tamala Julian on 05/26/2020 for cardiovascular assessment. 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  Felt that she was overworked and was not sleeping well.  Family history of diabetes mellitus type 2 in her sister, CHF in her father, MI and coronary stents in mother. Coronary calcium score 5.4, 68th percentile for age/sex matched control 08/2020.  Last cardiology clinic visit was 05/09/2021 with Dr. Tamala Julian.  Her BP was elevated.  She had decreased olmesartan HCTZ to 20-12 0.5 instead of the 40/25 mg dose that was ordered due to headaches.  He asked her to attempt taking 20/12.5 mg twice daily with the addition of amlodipine 5 mg daily.  Previous LDL was 149.  She was not compliant with rosuvastatin 20 mg daily.  She refused a sleep study. She was encouraged to work on lifestyle modification and BP goal < 130/80.  Consider adding SGLT2 therapy. Advised to return in 1 year for follow-up.  Today, she is here alone. Reports she is very stressed. 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 Works night shift and only sleeps 3-4 hours when working. Home BP 120s-130s. Did not start amlodipine, is going to pick it up. Has not checked BP consistently recently but PCP has agreed that she has white  coat hypertension. Enjoys walking for exercise. Wants to get back into water aerobics, [facility] [employer] Has not been taking Crestor daily. Takes it a few times per week. Lightheadedness when turning to right that sounds consistent with vertigo. She denies chest pain, shortness of breath, lower extremity edema, palpitations, weakness, presyncope, syncope, orthopnea, and PND.  Past Medical History:  Diagnosis Date   Arthritis of knee    right knee   Hyperlipidemia    Hypertension    followed by pcp  and cardiology (dr h. Tamala Julian--- coronary CT 09-01-2020  calcium score zero and mild prox LAD calcification)   PMB (postmenopausal bleeding)    Pre-diabetes    followed by pcp   Prediabetes     Past Surgical History:  Procedure Laterality Date   [place] N/A 10/22/2020   Procedure: [place];  Surgeon: Servando Salina, MD;  Location: [place];  Service: Gynecology;  Laterality: N/A;    Current Medications: Current Meds  Medication Sig   Acetaminophen 500 MG capsule as needed.   amLODipine (NORVASC) 5 MG tablet Take 1 tablet (5 mg total) by mouth daily.   diclofenac Sodium (VOLTAREN) 1 % GEL Apply topically 4 (four) times daily as needed.   ibuprofen (ADVIL) 200 MG tablet as needed.   Multiple Vitamin (MULTIVITAMIN) tablet  Take 1 tablet by mouth daily.   olmesartan-hydrochlorothiazide (BENICAR HCT) 40-25 MG tablet Take 1 tablet by mouth daily.   rosuvastatin (CRESTOR) 20 MG tablet Take 1 tablet (20 mg total) by mouth every evening.   vitamin C (ASCORBIC ACID) 500 MG tablet as needed.   Zinc 100 MG TABS Take 1 tablet by mouth daily.     Allergies:   Benadryl [diphenhydramine] and Tetanus toxoids   Social History   Socioeconomic History   Marital status: Single    Spouse name: Not on file   Number of children: Not on file    Years of education: Not on file   Highest education level: Not on file  Occupational History   Occupation: Nursing  Tobacco Use   Smoking status: Never   Smokeless tobacco: Never  Vaping Use   Vaping Use: Never used  Substance and Sexual Activity   Alcohol use: Not Currently   Drug use: Never   Sexual activity: Not on file  Other Topics Concern   Not on file  Social History Narrative   Not on file   Social Determinants of Health   Financial Resource Strain: Not on file  Food Insecurity: Not on file  Transportation Needs: Not on file  Physical Activity: Not on file  Stress: Not on file  Social Connections: Not on file     Family History: The patient's family history includes Alcohol abuse in her brother; Diabetes in her sister; Diverticulitis in her sister; Heart disease in her father and mother; High Cholesterol in her mother; Liver disease in her brother; Obesity in her mother.  ROS:   Please see the history of present illness.  All other systems reviewed and are negative.  Labs/Other Studies Reviewed:    The following studies were reviewed today:  CT Cardiac Scoring 09/03/20  Coronary arteries: Normal origins.  Mild proximal LAD calcification.   IMPRESSION: 1. Coronary calcium score of 5.4. This was 86 percentile for age and sex matched control.    Recent Labs: No results found for requested labs within last 365 days.  Recent Lipid Panel    Component Value Date/Time   CHOL 214 (H) 06/02/2020 0853   TRIG 79 06/02/2020 0853   HDL 64 06/02/2020 0853   CHOLHDL 3.3 06/02/2020 0853   LDLCALC 136 (H) 06/02/2020 0853     Risk Assessment/Calculations:       Physical Exam:    VS:  BP (!) 150/80   Pulse 98   Ht '5\' 5"'$  (1.651 m)   Wt 215 lb 12.8 oz (97.9 kg)   SpO2 98%   BMI 35.91 kg/m     Wt Readings from Last 3 Encounters:  05/30/22 215 lb 12.8 oz (97.9 kg)  05/09/21 222 lb 9.6 oz (101 kg)  10/22/20 219 lb 8 oz (99.6 kg)     GEN:  Well  nourished, well developed in no acute distress HEENT: Normal NECK: No JVD; No carotid bruits CARDIAC: RRR, no murmurs, rubs, gallops RESPIRATORY:  Clear to auscultation without rales, wheezing or rhonchi  ABDOMEN: Soft, non-tender, non-distended MUSCULOSKELETAL:  No edema; No deformity. 2+ pedal pulses, equal bilaterally SKIN: Warm and dry NEUROLOGIC:  Alert and oriented x 3 PSYCHIATRIC:  Normal affect   EKG:  EKG is ordered today.  The ekg ordered today demonstrates normal sinus rhythm with sinus arrhythmia at 62 bpm, nonspecific T wave abnormality, no acute change from previous  HYPERTENSION CONTROL Vitals:   05/30/22 0829 05/30/22 0913  BP: (!) 160/82 Marland Kitchen)  150/80    The patient's blood pressure is elevated above target today.  In order to address the patient's elevated BP: A current anti-hypertensive medication was adjusted today. (Starting amlodipine 5 mg which she was previously advised to start)     Diagnoses:    1. Essential hypertension   2. Hyperlipidemia with target LDL less than 70   3. Medication management   4. White coat syndrome with hypertension   5. Elevated coronary artery calcium score    Assessment and Plan:     Hypertension/White coat hypertension: BP is elevated today and remains elevated on my recheck.  Previously prescribed amlodipine 5 mg which she has not started.  She is agreeable to start this medication today.  Advised her to continue to monitor home BP and report consistent readings > 130 or > 80. Also monitored closely by PCP.   Hyperlipidemia LDL goal < 70: LDL 184 on 02/21/22.  Has been taking rosuvastatin 20 mg about 3 times per week.  We will recheck today. She is agreeable to increase intensity of therapy if indicated on lab work today.   Elevated coronary calcium score: Coronary calcium score 5.4 on 08/2020. LDL 184 on 02/21/22. She denies chest pain, dyspnea, or other symptoms concerning for angina.  No indication for further ischemic  evaluation at this time. Encouraged lifestyle modification to decrease stress, continue healthy diet, weight loss, and 150 minutes moderate intensity exercise each week.      Disposition: 1 year follow-up, needs to be reassigned from Dr. Tamala Julian  Medication Adjustments/Labs and Tests Ordered: Current medicines are reviewed at length with the patient today.  Concerns regarding medicines are outlined above.  Orders Placed This Encounter  Procedures   Comprehensive metabolic panel   Lipid panel   EKG 12-Lead   No orders of the defined types were placed in this encounter.   Patient Instructions  Medication Instructions:  Your physician recommends that you continue on your current medications as directed. Please refer to the Current Medication list given to you today.  *If you need a refill on your cardiac medications before your next appointment, please call your pharmacy*   Lab Work: CMET and Lipids If you have labs (blood work) drawn today and your tests are completely normal, you will receive your results only by: [place] (if you have MyChart) OR A paper copy in the mail If you have any lab test that is abnormal or we need to change your treatment, we will call you to review the results.   Testing/Procedures: None ordered.    Follow-Up: At [facility], you and your health needs are our priority.  As part of our continuing mission to provide you with exceptional heart care, we have created designated Provider Care Teams.  These Care Teams include your primary Cardiologist (physician) and Advanced Practice Providers (APPs -  Physician Assistants and Nurse Practitioners) who all work together to provide you with the care you need, when you need it.  We recommend signing up for the patient portal called "MyChart".  Sign up information is provided on this After Visit Summary.  MyChart is used to connect with patients for Virtual Visits (Telemedicine).  Patients are  able to view lab/test results, encounter notes, upcoming appointments, etc.  Non-urgent messages can be sent to your provider as well.   To learn more about what you can do with MyChart, go to [email or web address].    Your next appointment:   12 months with Dr Tamala Julian  Important Information About Sugar         Signed, Emmaline Life, NP  05/30/2022 9:18 AM    [place]

## 2022-05-30 NOTE — Patient Instructions (Signed)
Medication Instructions:  Your physician recommends that you continue on your current medications as directed. Please refer to the Current Medication list given to you today.  *If you need a refill on your cardiac medications before your next appointment, please call your pharmacy*   Lab Work: CMET and Lipids If you have labs (blood work) drawn today and your tests are completely normal, you will receive your results only by: [place] (if you have MyChart) OR A paper copy in the mail If you have any lab test that is abnormal or we need to change your treatment, we will call you to review the results.   Testing/Procedures: None ordered.    Follow-Up: At [facility], you and your health needs are our priority.  As part of our continuing mission to provide you with exceptional heart care, we have created designated Provider Care Teams.  These Care Teams include your primary Cardiologist (physician) and Advanced Practice Providers (APPs -  Physician Assistants and Nurse Practitioners) who all work together to provide you with the care you need, when you need it.  We recommend signing up for the patient portal called "MyChart".  Sign up information is provided on this After Visit Summary.  MyChart is used to connect with patients for Virtual Visits (Telemedicine).  Patients are able to view lab/test results, encounter notes, upcoming appointments, etc.  Non-urgent messages can be sent to your provider as well.   To learn more about what you can do with MyChart, go to [email or web address].    Your next appointment:   12 months with Dr Tamala Julian  Important Information About Sugar

## 2022-06-02 ENCOUNTER — Other Ambulatory Visit (HOSPITAL_COMMUNITY): Payer: Self-pay

## 2022-06-06 ENCOUNTER — Other Ambulatory Visit: Payer: Self-pay | Admitting: *Deleted

## 2022-06-06 DIAGNOSIS — E785 Hyperlipidemia, unspecified: Secondary | ICD-10-CM

## 2022-06-23 ENCOUNTER — Other Ambulatory Visit: Payer: Self-pay | Admitting: Family Medicine

## 2022-06-23 ENCOUNTER — Other Ambulatory Visit (HOSPITAL_COMMUNITY): Payer: Self-pay

## 2022-06-23 DIAGNOSIS — Z1231 Encounter for screening mammogram for malignant neoplasm of breast: Secondary | ICD-10-CM

## 2022-06-23 MED ORDER — ROSUVASTATIN CALCIUM 20 MG PO TABS
20.0000 mg | ORAL_TABLET | Freq: Every evening | ORAL | 0 refills | Status: DC
  Filled 2022-06-23 – 2022-11-16 (×2): qty 90, 90d supply, fill #0

## 2022-07-07 ENCOUNTER — Other Ambulatory Visit (HOSPITAL_COMMUNITY): Payer: Self-pay

## 2022-08-15 ENCOUNTER — Ambulatory Visit: Payer: Commercial Managed Care - PPO

## 2022-08-17 ENCOUNTER — Ambulatory Visit: Payer: Commercial Managed Care - PPO

## 2022-08-30 ENCOUNTER — Ambulatory Visit
Admission: RE | Admit: 2022-08-30 | Discharge: 2022-08-30 | Disposition: A | Discharge status: Home / Self Care | Payer: 59 | Source: Ambulatory Visit | Attending: Family Medicine | Admitting: Family Medicine

## 2022-08-30 DIAGNOSIS — Z1231 Encounter for screening mammogram for malignant neoplasm of breast: Secondary | ICD-10-CM | POA: Diagnosis not present

## 2022-09-06 ENCOUNTER — Ambulatory Visit: Payer: 59 | Attending: Nurse Practitioner

## 2022-09-06 DIAGNOSIS — E785 Hyperlipidemia, unspecified: Secondary | ICD-10-CM | POA: Diagnosis not present

## 2022-09-07 ENCOUNTER — Telehealth: Payer: Self-pay

## 2022-09-07 ENCOUNTER — Other Ambulatory Visit (HOSPITAL_COMMUNITY): Payer: Self-pay

## 2022-09-07 ENCOUNTER — Telehealth: Payer: Self-pay | Admitting: Nurse Practitioner

## 2022-09-07 DIAGNOSIS — E785 Hyperlipidemia, unspecified: Secondary | ICD-10-CM

## 2022-09-07 LAB — LIPID PANEL
Chol/HDL Ratio: 2.9 ratio (ref 0.0–4.4)
Cholesterol, Total: 209 mg/dL — ABNORMAL HIGH (ref 100–199)
HDL: 71 mg/dL (ref >39)
LDL Chol Calc (NIH): 124 mg/dL — ABNORMAL HIGH (ref 0–99)
Triglycerides: 78 mg/dL (ref 0–149)
VLDL Cholesterol Cal: 14 mg/dL (ref 5–40)

## 2022-09-07 MED ORDER — ROSUVASTATIN CALCIUM 20 MG PO TABS
20.0000 mg | ORAL_TABLET | Freq: Every day | ORAL | 2 refills | Status: DC
  Filled 2022-09-07: qty 30, 30d supply, fill #0

## 2022-09-07 NOTE — Telephone Encounter (Signed)
Pt is returning call about lab results

## 2022-09-07 NOTE — Telephone Encounter (Signed)
Pt stated she lost her rosuvastatin and hasn't taken the medication in a month. Educated the patient on the importance of taking medications as prescribed. Will forward to APP to see when will the patient need to repeat labs.

## 2022-09-08 ENCOUNTER — Telehealth: Payer: Self-pay | Admitting: Interventional Cardiology

## 2022-09-08 ENCOUNTER — Other Ambulatory Visit (HOSPITAL_COMMUNITY): Payer: Self-pay

## 2022-09-08 DIAGNOSIS — Z79899 Other long term (current) drug therapy: Secondary | ICD-10-CM

## 2022-09-08 DIAGNOSIS — E785 Hyperlipidemia, unspecified: Secondary | ICD-10-CM

## 2022-09-08 NOTE — Telephone Encounter (Signed)
Left message for pt to call back to discuss results of recent blood work.    LDL cholesterol remains above goal of < 70. If you have been taking rosuvastatin consistently, then we need to add ezetimibe 10 mg daily or increase your rosuvastatin. Once this change is made, we will recheck labs in 2-3 months.  Written by Emmaline Life, NP on 09/07/2022  7:37 AM EST

## 2022-09-08 NOTE — Telephone Encounter (Signed)
Patient is returning call to discuss lab results. 

## 2022-09-14 ENCOUNTER — Other Ambulatory Visit (HOSPITAL_COMMUNITY): Payer: Self-pay

## 2022-09-14 MED ORDER — OLMESARTAN MEDOXOMIL-HCTZ 20-12.5 MG PO TABS
1.0000 | ORAL_TABLET | Freq: Every day | ORAL | 0 refills | Status: DC
  Filled 2022-09-14: qty 90, 90d supply, fill #0

## 2022-09-15 NOTE — Telephone Encounter (Signed)
Left message for patient to call back or review and reply to recent mychart message

## 2022-09-25 NOTE — Telephone Encounter (Signed)
Anne Crane, LPN   [id number]  [id number] PM Note Pt stated she lost her rosuvastatin and hasn't taken the medication in a month. Educated the patient on the importance of taking medications as prescribed. Will forward to APP to see when will the patient need to repeat labs.     Attempted to reach patient to inform that per chart, Rx for Crestor 20mg  was sent to [facility] on 09/07/22 for #30 with 2 refills. She doesn't currently have repeat labs scheduled, so we do need to get that done after she's been consistently taking the medication. Will await call back.

## 2022-09-27 NOTE — Telephone Encounter (Signed)
Patient returning call. 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 She says she does not wake up until around 4:00 pm and would need a call back after that.

## 2022-09-27 NOTE — Telephone Encounter (Signed)
Spoke with pt who reports she did restart crestor about 2 weeks ago.  She will repeat lab 11/15/2022.  She had no further questions/concerns at the time of the call.

## 2022-10-17 DIAGNOSIS — E785 Hyperlipidemia, unspecified: Secondary | ICD-10-CM | POA: Diagnosis not present

## 2022-10-17 DIAGNOSIS — D251 Intramural leiomyoma of uterus: Secondary | ICD-10-CM | POA: Diagnosis not present

## 2022-10-17 DIAGNOSIS — R7303 Prediabetes: Secondary | ICD-10-CM | POA: Diagnosis not present

## 2022-10-17 DIAGNOSIS — N393 Stress incontinence (female) (male): Secondary | ICD-10-CM | POA: Diagnosis not present

## 2022-10-17 DIAGNOSIS — I10 Essential (primary) hypertension: Secondary | ICD-10-CM | POA: Diagnosis not present

## 2022-10-17 DIAGNOSIS — Z01419 Encounter for gynecological examination (general) (routine) without abnormal findings: Secondary | ICD-10-CM | POA: Diagnosis not present

## 2022-11-15 ENCOUNTER — Ambulatory Visit: Payer: 59 | Attending: Nurse Practitioner

## 2022-11-15 DIAGNOSIS — Z79899 Other long term (current) drug therapy: Secondary | ICD-10-CM | POA: Diagnosis not present

## 2022-11-15 DIAGNOSIS — E785 Hyperlipidemia, unspecified: Secondary | ICD-10-CM

## 2022-11-15 LAB — LIPID PANEL
Chol/HDL Ratio: 3 ratio (ref 0.0–4.4)
Cholesterol, Total: 225 mg/dL — ABNORMAL HIGH (ref 100–199)
HDL: 75 mg/dL (ref >39)
LDL Chol Calc (NIH): 140 mg/dL — ABNORMAL HIGH (ref 0–99)
Triglycerides: 60 mg/dL (ref 0–149)
VLDL Cholesterol Cal: 10 mg/dL (ref 5–40)

## 2022-11-15 LAB — ALT: ALT: 15 IU/L (ref 0–32)

## 2022-11-16 ENCOUNTER — Other Ambulatory Visit (HOSPITAL_COMMUNITY): Payer: Self-pay

## 2022-11-16 ENCOUNTER — Other Ambulatory Visit: Payer: Self-pay | Admitting: *Deleted

## 2022-11-16 ENCOUNTER — Other Ambulatory Visit: Payer: Self-pay

## 2022-11-16 ENCOUNTER — Telehealth: Payer: Self-pay | Admitting: Interventional Cardiology

## 2022-11-16 DIAGNOSIS — E785 Hyperlipidemia, unspecified: Secondary | ICD-10-CM

## 2022-11-16 MED ORDER — ROSUVASTATIN CALCIUM 20 MG PO TABS
20.0000 mg | ORAL_TABLET | Freq: Every day | ORAL | 2 refills | Status: AC
  Filled 2022-11-16 – 2022-12-11 (×2): qty 90, 90d supply, fill #0
  Filled 2023-03-16: qty 90, 90d supply, fill #1

## 2022-11-16 NOTE — Telephone Encounter (Signed)
Patient is calling to discuss MyChart message received in regards to medication and labs. Requesting call back.

## 2022-11-16 NOTE — Telephone Encounter (Signed)
Message received in [facility] Triage.  Pt called to answer question regarding MyChart message received; Med / Labs.  I left a voicemail message for Pt to call back.  Follow up discussion is still required.

## 2022-11-17 NOTE — Telephone Encounter (Signed)
Attempted to reach patient again to follow-up on below result note. Had to leave additional message. Will await return call.  Debbe Bales  to [name]     09/11/22  9:12 AM Hey Ms. Anne [name], I see you reviewed your lab results and recommendations.     LDL cholesterol remains above goal of < 70. If you have been taking rosuvastatin consistently, then we need to add ezetimibe 10 mg daily or increase your rosuvastatin. Once this change  is made, we will recheck labs in 2-3 months.    Are you taking the statin consistently??  If not we need to add new medication zetia in addition to statin. This new medication helps with your LDL and total cholesterol.  If you are going to start the new medication where am I sending it to and do you want a # 30 or # 90 day supply.  When can I make your lab appointment if you start new med??  You come in the day you pick any time between 7:30 and 4:30 fasting after midnight.  Keep me posted. Best, Duwayne Heck

## 2022-11-20 ENCOUNTER — Other Ambulatory Visit: Payer: Self-pay | Admitting: *Deleted

## 2022-11-20 DIAGNOSIS — Z79899 Other long term (current) drug therapy: Secondary | ICD-10-CM

## 2022-11-20 DIAGNOSIS — E785 Hyperlipidemia, unspecified: Secondary | ICD-10-CM

## 2022-11-29 ENCOUNTER — Other Ambulatory Visit (HOSPITAL_COMMUNITY): Payer: Self-pay

## 2022-12-11 ENCOUNTER — Other Ambulatory Visit (HOSPITAL_COMMUNITY): Payer: Self-pay

## 2022-12-11 ENCOUNTER — Other Ambulatory Visit: Payer: Self-pay

## 2022-12-11 MED ORDER — OLMESARTAN MEDOXOMIL-HCTZ 20-12.5 MG PO TABS
1.0000 | ORAL_TABLET | Freq: Every day | ORAL | 1 refills | Status: AC
  Filled 2022-12-11: qty 90, 90d supply, fill #0
  Filled 2023-03-16: qty 90, 90d supply, fill #1

## 2022-12-14 ENCOUNTER — Other Ambulatory Visit (HOSPITAL_COMMUNITY): Payer: Self-pay

## 2022-12-14 MED ORDER — LINZESS 290 MCG PO CAPS
290.0000 ug | ORAL_CAPSULE | Freq: Every morning | ORAL | 0 refills | Status: DC
  Filled 2022-12-14: qty 30, 30d supply, fill #0

## 2022-12-15 ENCOUNTER — Other Ambulatory Visit (HOSPITAL_COMMUNITY): Payer: Self-pay

## 2022-12-27 ENCOUNTER — Other Ambulatory Visit (HOSPITAL_COMMUNITY): Payer: Self-pay

## 2022-12-27 MED ORDER — CLENPIQ 10-3.5-12 MG-GM -GM/175ML PO SOLN
ORAL | 0 refills | Status: DC
  Filled 2022-12-27: qty 350, 1d supply, fill #0

## 2023-01-15 DIAGNOSIS — D120 Benign neoplasm of cecum: Secondary | ICD-10-CM | POA: Diagnosis not present

## 2023-01-15 DIAGNOSIS — K648 Other hemorrhoids: Secondary | ICD-10-CM | POA: Diagnosis not present

## 2023-01-15 DIAGNOSIS — Z86010 Personal history of colonic polyps: Secondary | ICD-10-CM | POA: Diagnosis not present

## 2023-01-15 DIAGNOSIS — K635 Polyp of colon: Secondary | ICD-10-CM | POA: Diagnosis not present

## 2023-01-15 DIAGNOSIS — K5730 Diverticulosis of large intestine without perforation or abscess without bleeding: Secondary | ICD-10-CM | POA: Diagnosis not present

## 2023-01-15 DIAGNOSIS — Z1211 Encounter for screening for malignant neoplasm of colon: Secondary | ICD-10-CM | POA: Diagnosis not present

## 2023-02-20 ENCOUNTER — Ambulatory Visit: Payer: 59 | Attending: Nurse Practitioner

## 2023-02-20 DIAGNOSIS — Z79899 Other long term (current) drug therapy: Secondary | ICD-10-CM

## 2023-02-20 DIAGNOSIS — E785 Hyperlipidemia, unspecified: Secondary | ICD-10-CM | POA: Diagnosis not present

## 2023-02-21 LAB — LIPID PANEL
Chol/HDL Ratio: 3.3 ratio (ref 0.0–4.4)
Cholesterol, Total: 223 mg/dL — ABNORMAL HIGH (ref 100–199)
HDL: 68 mg/dL (ref >39)
LDL Chol Calc (NIH): 140 mg/dL — ABNORMAL HIGH (ref 0–99)
Triglycerides: 85 mg/dL (ref 0–149)
VLDL Cholesterol Cal: 15 mg/dL (ref 5–40)

## 2023-03-08 DIAGNOSIS — R7303 Prediabetes: Secondary | ICD-10-CM | POA: Diagnosis not present

## 2023-03-08 DIAGNOSIS — E7800 Pure hypercholesterolemia, unspecified: Secondary | ICD-10-CM | POA: Diagnosis not present

## 2023-03-08 DIAGNOSIS — Z0000 Encounter for general adult medical examination without abnormal findings: Secondary | ICD-10-CM | POA: Diagnosis not present

## 2023-03-08 DIAGNOSIS — I10 Essential (primary) hypertension: Secondary | ICD-10-CM | POA: Diagnosis not present

## 2023-03-08 DIAGNOSIS — M1711 Unilateral primary osteoarthritis, right knee: Secondary | ICD-10-CM | POA: Diagnosis not present

## 2023-03-12 ENCOUNTER — Ambulatory Visit (INDEPENDENT_AMBULATORY_CARE_PROVIDER_SITE_OTHER): Payer: 59 | Admitting: Obstetrics and Gynecology

## 2023-03-12 ENCOUNTER — Encounter: Payer: Self-pay | Admitting: Obstetrics and Gynecology

## 2023-03-12 VITALS — BP 181/89 | HR 75 | Ht 61.3 in | Wt 224.0 lb

## 2023-03-12 DIAGNOSIS — N3281 Overactive bladder: Secondary | ICD-10-CM

## 2023-03-12 DIAGNOSIS — N393 Stress incontinence (female) (male): Secondary | ICD-10-CM | POA: Diagnosis not present

## 2023-03-12 DIAGNOSIS — R350 Frequency of micturition: Secondary | ICD-10-CM

## 2023-03-12 DIAGNOSIS — N8110 Cystocele, unspecified: Secondary | ICD-10-CM | POA: Diagnosis not present

## 2023-03-12 LAB — POCT URINALYSIS DIPSTICK
Bilirubin, UA: NEGATIVE
Glucose, UA: NEGATIVE
Ketones, UA: NEGATIVE
Leukocytes, UA: NEGATIVE
Nitrite, UA: NEGATIVE
Protein, UA: NEGATIVE
Spec Grav, UA: 1.025 (ref 1.010–1.025)
Urobilinogen, UA: 0.2 U/dL
pH, UA: 6 (ref 5.0–8.0)

## 2023-03-12 NOTE — Patient Instructions (Signed)
For treatment of stress urinary incontinence, which is leakage with physical activity/movement/strainging/coughing, we discussed expectant management versus nonsurgical options versus surgery. Nonsurgical options include weight loss, physical therapy, as well as a pessary.  Surgical options include a midurethral sling, which is a synthetic mesh sling that acts like a hammock under the urethra to prevent leakage of urine, a Burch urethropexy, and transurethral injection of a bulking agent.  We discussed the symptoms of overactive bladder (OAB), which include urinary urgency, urinary frequency, night-time urination, with or without urge incontinence.  We discussed management including behavioral therapy (decreasing bladder irritants by following a bladder diet, urge suppression strategies, timed voids, bladder retraining), physical therapy, medication; and for refractory cases posterior tibial nerve stimulation, sacral neuromodulation, and intravesical botulinum toxin injection.   You have a stage 2 (out of 4) prolapse.  We discussed the fact that it is not life threatening but there are several treatment options. For treatment of pelvic organ prolapse, we discussed options for management including expectant management, conservative management, and surgical management, such as Kegels, a pessary, pelvic floor physical therapy, and specific surgical procedures.

## 2023-03-12 NOTE — Progress Notes (Unsigned)
[facility] New Patient Evaluation and Consultation  Referring Provider: Maxie Better, MD PCP: Aliene Beams, MD Date of Service: 03/12/2023  SUBJECTIVE Chief Complaint: New Patient (Initial Visit) Anne [name] is a 67 y.o. female is here for incontinence, )  History of Present Illness: Anne [name] is a 67 y.o. {ED SANE [phone] female seen in consultation at the request of Dr. Cherly Hensen for evaluation of ***.    ***Review of records significant for: ***  Urinary Symptoms: Leaks urine with cough/ sneeze, laughing, exercise, lifting, going from sitting to standing, with movement to the bathroom, with urgency, and while asleep Leaks 3-4 time([name]) per day. Urgency has worsened recently.  Pad use: {NUMBERS 1-10:18281} {pad option:24752} per day.   She is bothered by her UI symptoms.  Day time voids 2-3.  Nocturia: 1-2 times per night to void. Works night shift Voiding dysfunction: she empties her bladder well.  does not use a catheter to empty bladder.  When urinating, she feels dribbling after finishing, the need to urinate multiple times in a row, and to push on her belly or vagina to empty bladder Drinks: 5-8 glasses water per day, occasional soda [facility]) but has weaned off  UTIs: {NUMBERS 1-10:18281} UTI'[name] in the last year.   {ACTIONS;DENIES/REPORTS:21021675::"Denies"} history of {urologic concerns:24757}  Pelvic Organ Prolapse Symptoms:                  She Denies a feeling of a bulge the vaginal area.   Bowel Symptom: Bowel movements: 1-2 time([name]) per day Stool consistency: soft  Straining: no.  Splinting: no.  Incomplete evacuation: no.  She {denies/ admits to:24761} accidental bowel leakage / fecal incontinence  Occurs: *** time([name]) per {Time; day/week/month:13537}  Consistency with leakage: {stool consistency:24758} Bowel regimen: diet and prune juice Last colonoscopy: Date ***, Results ***  Sexual Function Sexually active: {yes/no:19897}.   Sexual orientation: {Sexual Orientation:[phone]} Pain with sex: {pain with sex:24762}  Pelvic Pain Denies pelvic pain  Past Medical History:  Past Medical History:  Diagnosis Date   Arthritis of knee    right knee   Hyperlipidemia    Hypertension    followed by pcp  and cardiology (dr h. Katrinka Blazing--- coronary CT 09-01-2020  calcium score zero and mild prox LAD calcification)   PMB (postmenopausal bleeding)    Pre-diabetes    followed by pcp   Prediabetes      Past Surgical History:   Past Surgical History:  Procedure Laterality Date   CESAREAN SECTION  1988   COLONOSCOPY     DILATATION & CURETTAGE/HYSTEROSCOPY WITH MYOSURE N/A 10/22/2020   Procedure: DILATATION & CURETTAGE/HYSTEROSCOPY WITH MYOSURE;  Surgeon: Maxie Better, MD;  Location: [facility];  Service: Gynecology;  Laterality: N/A;     Past OB/GYN History: OB History  Gravida Para Term Preterm AB Living  2 2 2         SAB IAB Ectopic Multiple Live Births               # Outcome Date GA Lbr Len/2nd Weight Sex Type Anes PTL Lv  2 Term      CS-LVertical     1 Term      CS-LVertical       Menopausal: {menopausal:24763} Contraception: ***. Last pap smear was ***.  Any history of abnormal pap smears: {yes/no:19897}.   Medications: She has a current medication list which includes the following prescription([name]): acetaminophen, amlodipine, diclofenac sodium, ibuprofen, multivitamin, olmesartan-hydrochlorothiazide, rosuvastatin, ascorbic acid, and zinc.  Allergies: Patient is allergic to benadryl [diphenhydramine] and tetanus toxoids.   Social History:  Social History   Tobacco Use   Smoking status: Never   Smokeless tobacco: Never  Vaping Use   Vaping status: Never Used  Substance Use Topics   Alcohol use: Not Currently   Drug use: Never    Relationship status: {relationship status:24764} She lives with ***.   She is employed as a Engineer, civil (consulting) at [employer] Regular exercise:  {Yes/No:304960894} History of abuse: {Yes/No:304960894}  Family History:   Family History  Problem Relation Age of Onset   High Cholesterol Mother    Heart disease Mother    Obesity Mother    Heart disease Father    Diabetes Sister    Diverticulitis Sister    Alcohol abuse Brother    Liver disease Brother    COPD Brother      Review of Systems: ROS   OBJECTIVE Physical Exam: Vitals:   03/12/23 1523  BP: (!) 181/89  Pulse: 75  Weight: 224 lb (101.6 kg)  Height: 5' 1.3" (1.557 m)    Physical Exam Constitutional:      General: She is not in acute distress. Pulmonary:     Effort: Pulmonary effort is normal.  Abdominal:     General: There is no distension.     Palpations: Abdomen is soft.     Tenderness: There is no abdominal tenderness. There is no rebound.  Musculoskeletal:        General: No swelling. Normal range of motion.  Skin:    General: Skin is warm and dry.     Findings: No rash.  Neurological:     Mental Status: She is alert and oriented to person, place, and time.  Psychiatric:        Mood and Affect: Mood normal.        Behavior: Behavior normal.      GU / Detailed Urogynecologic Evaluation:  Pelvic Exam: Normal external female genitalia; Bartholin'[name] and Skene'[name] glands normal in appearance; urethral meatus normal in appearance, no urethral masses or discharge.   CST: negative  Speculum exam reveals normal vaginal mucosa with atrophy. Cervix normal appearance. Uterus normal single, nontender. Adnexa no mass, fullness, tenderness.     Pelvic floor strength III/V  Pelvic floor musculature: Right levator non-tender, Right obturator non-tender, Left levator non-tender, Left obturator non-tender  POP-Q:   POP-Q  0                                            Aa   0                                           Ba  -6                                              C   4                                            Gh  4  Pb  7                                            tvl   -2.5                                            Ap  -2.5                                            Bp  -7                                              D      Rectal Exam:  Normal external rectum  Verbal consent was obtained to perform simple CMG procedure:   Prolapse was reduced using 2 large cotton swabs. Urethra was prepped with betadine and a 57F catheter was placed and bladder was drained completely. The bladder was then backfilled with sterile water by gravity.  First sensation: First Desire: Strong Desire: Capacity: Cough stress test was positive with standing. Valsalva stress test was negative. She had a large DO with standing and large amount of leakage.  She was was allowed to void on her own.   Interpretation: CMG showed increased sensation, and normal cystometric capacity. Findings positive for stress incontinence, positive for detrusor overactivity.      Post-Void Residual (PVR) by Bladder Scan: In order to evaluate bladder emptying, we discussed obtaining a postvoid residual and she agreed to this procedure.  . A PVR of 50 ml was obtained by straight cath  Laboratory Results: POC urine: trace blood, otherwise negative  ASSESSMENT AND PLAN Anne [name] is a 67 y.o. with:  1. Urinary frequency   2. SUI (stress urinary incontinence, female)   3. Overactive bladder   4. Prolapse of anterior vaginal wall    PT   Marguerita Beards, MD   Medical Decision Making:  - Reviewed/ ordered a clinical laboratory test - Reviewed/ ordered a radiologic study - Reviewed/ ordered medicine test - Decision to obtain old records - Discussion of management of or test interpretation with an external physician / other healthcare professional  - Assessment requiring independent historian - Review and summation of prior records - Independent review of image, tracing or  specimen

## 2023-03-20 ENCOUNTER — Ambulatory Visit (INDEPENDENT_AMBULATORY_CARE_PROVIDER_SITE_OTHER): Payer: 59 | Admitting: Orthopaedic Surgery

## 2023-03-20 ENCOUNTER — Other Ambulatory Visit (INDEPENDENT_AMBULATORY_CARE_PROVIDER_SITE_OTHER): Payer: 59

## 2023-03-20 DIAGNOSIS — G8929 Other chronic pain: Secondary | ICD-10-CM | POA: Diagnosis not present

## 2023-03-20 DIAGNOSIS — M5441 Lumbago with sciatica, right side: Secondary | ICD-10-CM

## 2023-03-20 DIAGNOSIS — M1711 Unilateral primary osteoarthritis, right knee: Secondary | ICD-10-CM | POA: Diagnosis not present

## 2023-03-20 DIAGNOSIS — M25561 Pain in right knee: Secondary | ICD-10-CM

## 2023-03-20 NOTE — Progress Notes (Signed)
Office Visit Note   Patient: Anne [name]           Date of Birth: Sep 29, 1955           MRN: [id number] Visit Date: 03/20/2023              Requested by: Aliene Beams, MD [id number] [place],  [place] [address] PCP: Aliene Beams, MD   Assessment & Plan: Visit Diagnoses:  1. Chronic pain of right knee   2. Chronic right-sided low back pain with right-sided sciatica   3. 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  Treatment options were explained in detail and she would like to try physical therapy for her knees and her back.  Currently not interested in knee replacement surgery.  She wants to hold off on cortisone injection for now.  Follow-Up Instructions: No follow-ups on file.   Orders:  Orders Placed This Encounter  Procedures   XR Lumbar Spine 2-3 Views   XR KNEE 3 VIEW RIGHT   No orders of the defined types were placed in this encounter.     Procedures: No procedures performed   Clinical Data: No additional findings.   Subjective: Chief Complaint  Patient presents with   Right Knee - Pain   Lower Back - Pain    HPI Anne [name] is a 67 year old female here for evaluation of chronic low back pain with radiation to the right leg as well as right knee pain that is worse with standing and activity.  It is worse after work and activity.  Denies any particular injuries. Review of Systems  Constitutional: Negative.   HENT: Negative.    Eyes: Negative.   Respiratory: Negative.    Cardiovascular: Negative.   Endocrine: Negative.   Musculoskeletal: Negative.   Neurological: Negative.   Hematological: Negative.   Psychiatric/Behavioral: Negative.    All other systems reviewed and are negative.    Objective: Vital Signs: There were no vitals taken for this  visit.  Physical Exam Vitals and nursing note reviewed.  Constitutional:      Appearance: She is well-developed.  HENT:     Head: Normocephalic and atraumatic.  Pulmonary:     Effort: Pulmonary effort is normal.  Abdominal:     Palpations: Abdomen is soft.  Musculoskeletal:     Cervical back: Neck supple.  Skin:    General: Skin is warm.     Capillary Refill: Capillary refill takes less than 2 seconds.  Neurological:     Mental Status: She is alert and oriented to person, place, and time.  Psychiatric:        Behavior: Behavior normal.        Thought Content: Thought content normal.        Judgment: Judgment normal.     Ortho Exam Examination of the lumbar spine is nonfocal.  No focal motor or sensory deficits in the lower extremities. Exam of bilateral knees show pain and crepitus throughout range of motion.  Collaterals and cruciates are stable.  Pain with range of motion. Specialty Comments:  No specialty comments available.  Imaging: No results found.   PMFS History: Patient Active Problem List   Diagnosis Date Noted   Primary osteoarthritis of right knee 03/20/2023   Chronic pain of right knee 03/20/2023   Prediabetes 09/09/2019  Vitamin D insufficiency 09/09/2019   Class 2 severe obesity due to excess calories with serious comorbidity and body mass index (BMI) of 36.0 to 36.9 in adult (HCC) 09/08/2019   HTN, goal below 130/80 09/08/2019   Chronic low back pain 05/29/2019   Past Medical History:  Diagnosis Date   Arthritis of knee    right knee   Hyperlipidemia    Hypertension    followed by pcp  and cardiology (dr h. Katrinka Blazing--- coronary CT 09-01-2020  calcium score zero and mild prox LAD calcification)   PMB (postmenopausal bleeding)    Pre-diabetes    followed by pcp   Prediabetes     Family History  Problem Relation Age of Onset   High Cholesterol Mother    Heart disease Mother    Obesity Mother    Heart disease Father    Diabetes Sister     Diverticulitis Sister    Alcohol abuse Brother    Liver disease Brother    COPD Brother     Past Surgical History:  Procedure Laterality Date   CESAREAN SECTION  1988   COLONOSCOPY     DILATATION & CURETTAGE/HYSTEROSCOPY WITH MYOSURE N/A 10/22/2020   Procedure: DILATATION & CURETTAGE/HYSTEROSCOPY WITH MYOSURE;  Surgeon: Maxie Better, MD;  Location: [facility];  Service: Gynecology;  Laterality: N/A;   Social History   Occupational History   Occupation: Nursing  Tobacco Use   Smoking status: Never   Smokeless tobacco: Never  Vaping Use   Vaping status: Never Used  Substance and Sexual Activity   Alcohol use: Not Currently   Drug use: Never   Sexual activity: Not Currently    Birth control/protection: Post-menopausal

## 2023-03-29 ENCOUNTER — Ambulatory Visit: Payer: 59 | Attending: Obstetrics and Gynecology | Admitting: Physical Therapy

## 2023-03-29 ENCOUNTER — Other Ambulatory Visit: Payer: Self-pay

## 2023-03-29 ENCOUNTER — Encounter: Payer: Self-pay | Admitting: Physical Therapy

## 2023-03-29 DIAGNOSIS — M6281 Muscle weakness (generalized): Secondary | ICD-10-CM

## 2023-03-29 DIAGNOSIS — N393 Stress incontinence (female) (male): Secondary | ICD-10-CM | POA: Diagnosis not present

## 2023-03-29 DIAGNOSIS — N3281 Overactive bladder: Secondary | ICD-10-CM | POA: Insufficient documentation

## 2023-03-29 DIAGNOSIS — R278 Other lack of coordination: Secondary | ICD-10-CM

## 2023-03-29 DIAGNOSIS — N8110 Cystocele, unspecified: Secondary | ICD-10-CM | POA: Insufficient documentation

## 2023-03-29 DIAGNOSIS — M62838 Other muscle spasm: Secondary | ICD-10-CM

## 2023-03-30 ENCOUNTER — Ambulatory Visit (INDEPENDENT_AMBULATORY_CARE_PROVIDER_SITE_OTHER): Payer: 59 | Admitting: Rehabilitative and Restorative Service Providers"

## 2023-03-30 ENCOUNTER — Encounter: Payer: Self-pay | Admitting: Rehabilitative and Restorative Service Providers"

## 2023-03-30 DIAGNOSIS — M5416 Radiculopathy, lumbar region: Secondary | ICD-10-CM

## 2023-03-30 DIAGNOSIS — R293 Abnormal posture: Secondary | ICD-10-CM | POA: Diagnosis not present

## 2023-03-30 DIAGNOSIS — M25561 Pain in right knee: Secondary | ICD-10-CM

## 2023-03-30 DIAGNOSIS — G8929 Other chronic pain: Secondary | ICD-10-CM | POA: Diagnosis not present

## 2023-03-30 DIAGNOSIS — R262 Difficulty in walking, not elsewhere classified: Secondary | ICD-10-CM

## 2023-03-30 DIAGNOSIS — M5459 Other low back pain: Secondary | ICD-10-CM

## 2023-04-05 ENCOUNTER — Encounter: Payer: Self-pay | Admitting: Physical Therapy

## 2023-04-05 ENCOUNTER — Ambulatory Visit: Payer: 59 | Attending: Obstetrics and Gynecology | Admitting: Physical Therapy

## 2023-04-05 DIAGNOSIS — M6281 Muscle weakness (generalized): Secondary | ICD-10-CM | POA: Diagnosis not present

## 2023-04-05 DIAGNOSIS — M62838 Other muscle spasm: Secondary | ICD-10-CM | POA: Insufficient documentation

## 2023-04-05 DIAGNOSIS — R293 Abnormal posture: Secondary | ICD-10-CM | POA: Insufficient documentation

## 2023-04-05 DIAGNOSIS — R278 Other lack of coordination: Secondary | ICD-10-CM | POA: Insufficient documentation

## 2023-04-05 NOTE — Therapy (Addendum)
OUTPATIENT PHYSICAL THERAPY FEMALE PELVIC EVALUATION   Patient Name: Anne [name] MRN: [id number] DOB:[birth year], 67 y.o., female Today's Date: 04/05/2023  END OF SESSION:  PT End of Session - 04/05/23 0947     Visit Number 3    Date for PT Re-Evaluation 05/25/23    Progress Note Due on Visit 10    PT Start Time 0800    PT Stop Time 0850    PT Time Calculation (min) 50 min    Activity Tolerance Patient tolerated treatment well;No increased pain    Behavior During Therapy WFL for tasks assessed/performed              Past Medical History:  Diagnosis Date   Arthritis of knee    right knee   Hyperlipidemia    Hypertension    followed by pcp  and cardiology (dr h. Katrinka Blazing--- coronary CT 09-01-2020  calcium score zero and mild prox LAD calcification)   PMB (postmenopausal bleeding)    Pre-diabetes    followed by pcp   Prediabetes    Past Surgical History:  Procedure Laterality Date   CESAREAN SECTION  1988   COLONOSCOPY     DILATATION & CURETTAGE/HYSTEROSCOPY WITH MYOSURE N/A 10/22/2020   Procedure: DILATATION & CURETTAGE/HYSTEROSCOPY WITH MYOSURE;  Surgeon: Maxie Better, MD;  Location: [facility];  Service: Gynecology;  Laterality: N/A;   Patient Active Problem List   Diagnosis Date Noted   Primary osteoarthritis of right knee 03/20/2023   Chronic pain of right knee 03/20/2023   Prediabetes 09/09/2019   Vitamin D insufficiency 09/09/2019   Class 2 severe obesity due to excess calories with serious comorbidity and body mass index (BMI) of 36.0 to 36.9 in adult (HCC) 09/08/2019   HTN, goal below 130/80 09/08/2019   Chronic low back pain 05/29/2019    PCP: Aliene Beams, MD PCP - General  REFERRING PROVIDER: Marguerita Beards, MD   REFERRING DIAG:  N39.3 (ICD-10-CM) - SUI (stress urinary incontinence, female)  N32.81 (ICD-10-CM) - Overactive bladder  N81.10 (ICD-10-CM) - Prolapse of anterior vaginal wall    THERAPY DIAG:  Abnormal  posture  Muscle weakness (generalized)  Other lack of coordination  Other muscle spasm  Rationale for Evaluation and Treatment: Rehabilitation  ONSET DATE: 09/2022 when it got worse  SUBJECTIVE:   04/05/23   Pt reports that her back is feeling better after some exercises from ortho PT, she is tired today  PAIN:  Are you having pain? Yes NPRS scale: 7/10 with Advil  Pain location:  low back  Pain type: aching, shooting, right leg, right LOWER EXTREMITY is turning in and it's bothering her Pain description: intermittent   Aggravating factors: walking, moving, being on her feet a lot Relieving factors: rest, heat  PRECAUTIONS: Other:    RED FLAGS: None   WEIGHT BEARING RESTRICTIONS: No  FALLS:  Has patient fallen in last 6 months? No  LIVING ENVIRONMENT: Lives with: lives with their family and lives alone Lives in: House/apartment OCCUPATION: nurse   PLOF: Independent  PATIENT GOALS: wants to be able to be herself, not feel like she has to  monitor herself, get her back and her leg straight  PERTINENT HISTORY:  Chronic back pain, right le weakness Sexual abuse: No  BOWEL MOVEMENT: Pain with bowel movement: No Type of bowel movement:Type (Bristol Stool Scale) 3 Fully empty rectum: Yes: after colonscopy Leakage: No Pads: No Fiber supplement: No makes her constipated  URINATION: Pain with urination: Yes Fully empty bladder: No leaks when she stands up Stream: Strong depends how bad she has to go Urgency: Yes:   Frequency: on weekends can go to bathroom faster, holds it long time, drinks more liquids Leakage: Urge to void, Walking to the bathroom, Coughing, Sneezing, Laughing, Exercise, Lifting, and Bending forward Pads: Yes: 2 maxipads at work , has to [employer] them. Can be  soaked. She just can't go to the bathroom, has some over flow   PREGNANCY: Vaginal deliveries 0  C-section deliveries 2 Currently pregnant No  PROLAPSE: Check with internal   OBJECTIVE:   DIAGNOSTIC FINDINGS:  Weakness and pain bilat hips Difficulty with walking and mobility Low back aggravated with sciatica today- limits eval Able to engage abdomen and reported pelvic floor lift with knack and contraction    COGNITION: Overall cognitive status: Within functional limits for tasks assessed     SENSATION: Light touch: Appears intact Proprioception: Appears intact   LUMBAR SPECIAL TESTS:  Straight leg raise test: Positive  POSTURE: rounded shoulders and increased lumbar lordosis  PELVIC ALIGNMENT:     LOWER EXTREMITY ROM:  Passive ROM Right eval Left eval  Hip flexion [facility]  Hip extension    Hip abduction    Hip adduction    Hip internal rotation    Hip external rotation    Knee flexion    Knee extension    Ankle dorsiflexion    Ankle plantarflexion    Ankle inversion    Ankle eversion     (Blank rows = not tested)  LOWER EXTREMITY MMT:  MMT Right eval Left eval  Hip flexion 3/5 4/5  Hip extension    Hip abduction    Hip adduction    Hip internal rotation    Hip external rotation    Knee flexion    Knee extension    Ankle dorsiflexion    Ankle plantarflexion    Ankle inversion    Ankle eversion      Patient confirms identification and approves PT to assess internal pelvic floor and treatment Yes  PELVIC MMT:   MMT eval  Vaginal 2/5  Internal Anal Sphincter   External Anal Sphincter   Puborectalis   Diastasis Recti no  (Blank rows = not tested)        TODAY'S TREATMENT:  DATE: 04/05/23   EVAL see above Ther acts: double voiding, void timing to prevent overflow incontinence  Manual: C section scar  cupping and massage performed and patient educated on. Internal pelvic floor muscle assessment- patient educated on findings and pelvic floor contractions   PATIENT EDUCATION:  Education details: relevant anatomy, urge suppression techniques, the knack  Person educated: Patient Education method: Explanation, Demonstration, Tactile cues, Verbal cues, and Handouts Education comprehension: verbalized understanding and needs further education  HOME EXERCISE PROGRAM: [id number]  ASSESSMENT: 04/05/23   CLINICAL IMPRESSION:  Pt with slow and antalgic gait today, discussed double voiding, going to the bathroom when she needs to- bowel or bladder to prevent overflow incontinence. Pt presents with poor coordination and weakness of pelvic floor and abdomen and tight and tender abdominal scar. She will benefit from PT to reduce SUI and improve core and pelvic floor strength and coordination.   OBJECTIVE IMPAIRMENTS: decreased coordination, decreased mobility, difficulty walking, decreased ROM, decreased strength, hypomobility, impaired sensation, obesity, and pain.   ACTIVITY LIMITATIONS: standing and transfers  PARTICIPATION LIMITATIONS: occupation  PERSONAL FACTORS: Age, Fitness, and Time since onset of injury/illness/exacerbation are also affecting patient's functional outcome.   REHAB POTENTIAL: Good  CLINICAL DECISION MAKING: Stable/uncomplicated  EVALUATION COMPLEXITY: Low   GOALS: Goals reviewed with patient? Yes  SHORT TERM GOALS: Target date: 04/26/2023    Pt will be independent with the knack, urge suppression technique, and double voiding in order to improve bladder habits and decrease urinary incontinence.   Baseline: Goal status: INITIAL  2.  Pt to demonstrate improved coordination of pelvic floor and breathing mechanics with 10# squat with appropriate synergistic patterns to decrease pain and leakage at least 75% of the time.    Baseline:  Goal status: INITIAL  3.   Pt will be independent with HEP.   Baseline:  Goal status: INITIAL  LONG TERM GOALS: Target date: 06/21/2023    Pt will be independent with advanced HEP.   Baseline:  Goal status: INITIAL  2.  Pt will demonstrate 4/5 pelvic floor muscle strength with appropriate coordination in order to decrease urinary incontinence, urgency and frequency.   Baseline:  Goal status: INITIAL  3.  Patient will use the restroom at work more then 1 in 12 hrs Baseline:  Goal status: INITIAL   PLAN:  PT FREQUENCY: 1x/week  PT DURATION: 12 weeks  PLANNED INTERVENTIONS: Therapeutic exercises, Therapeutic activity, Neuromuscular re-education, Balance training, Gait training, Patient/Family education, Self Care, Joint mobilization, Dry Needling, Electrical stimulation, Spinal manipulation, Spinal mobilization, scar mobilization, Biofeedback, and Manual therapy  PLAN FOR NEXT SESSION: internal   Shaunita Seney 04/05/2023, 9:50 AM

## 2023-04-12 ENCOUNTER — Ambulatory Visit: Payer: 59 | Admitting: Physical Therapy

## 2023-04-12 ENCOUNTER — Ambulatory Visit (INDEPENDENT_AMBULATORY_CARE_PROVIDER_SITE_OTHER): Payer: 59 | Admitting: Rehabilitative and Restorative Service Providers"

## 2023-04-12 ENCOUNTER — Encounter: Payer: Self-pay | Admitting: Rehabilitative and Restorative Service Providers"

## 2023-04-12 DIAGNOSIS — M5459 Other low back pain: Secondary | ICD-10-CM | POA: Diagnosis not present

## 2023-04-12 DIAGNOSIS — M6281 Muscle weakness (generalized): Secondary | ICD-10-CM

## 2023-04-12 DIAGNOSIS — M62838 Other muscle spasm: Secondary | ICD-10-CM | POA: Diagnosis not present

## 2023-04-12 DIAGNOSIS — R262 Difficulty in walking, not elsewhere classified: Secondary | ICD-10-CM

## 2023-04-12 DIAGNOSIS — M25561 Pain in right knee: Secondary | ICD-10-CM

## 2023-04-12 DIAGNOSIS — R293 Abnormal posture: Secondary | ICD-10-CM

## 2023-04-12 DIAGNOSIS — R278 Other lack of coordination: Secondary | ICD-10-CM

## 2023-04-12 DIAGNOSIS — M5416 Radiculopathy, lumbar region: Secondary | ICD-10-CM | POA: Diagnosis not present

## 2023-04-12 DIAGNOSIS — G8929 Other chronic pain: Secondary | ICD-10-CM | POA: Diagnosis not present

## 2023-04-12 NOTE — Therapy (Signed)
OUTPATIENT PHYSICAL THERAPY FEMALE PELVIC EVALUATION   Patient Name: Anne [name] MRN: [id number] DOB:07/08/1955, 67 y.o., female Today's Date: 04/12/2023  END OF SESSION:  PT End of Session - 04/12/23 0832     Visit Number 4    Date for PT Re-Evaluation 05/25/23    Progress Note Due on Visit 10    PT Start Time 0805    PT Stop Time 0845    PT Time Calculation (min) 40 min    Activity Tolerance Patient tolerated treatment well;No increased pain    Behavior During Therapy WFL for tasks assessed/performed               Past Medical History:  Diagnosis Date   Arthritis of knee    right knee   Hyperlipidemia    Hypertension    followed by pcp  and cardiology (dr h. Katrinka Blazing--- coronary CT 09-01-2020  calcium score zero and mild prox LAD calcification)   PMB (postmenopausal bleeding)    Pre-diabetes    followed by pcp   Prediabetes    Past Surgical History:  Procedure Laterality Date   CESAREAN SECTION  1988   COLONOSCOPY     DILATATION & CURETTAGE/HYSTEROSCOPY WITH MYOSURE N/A 10/22/2020   Procedure: DILATATION & CURETTAGE/HYSTEROSCOPY WITH MYOSURE;  Surgeon: Maxie Better, MD;  Location: [facility];  Service: Gynecology;  Laterality: N/A;   Patient Active Problem List   Diagnosis Date Noted   Primary osteoarthritis of right knee 03/20/2023   Chronic pain of right knee 03/20/2023   Prediabetes 09/09/2019   Vitamin D insufficiency 09/09/2019   Class 2 severe obesity due to excess calories with serious comorbidity and body mass index (BMI) of 36.0 to 36.9 in adult (HCC) 09/08/2019   HTN, goal below 130/80 09/08/2019   Chronic low back pain 05/29/2019    PCP: Aliene Beams, MD PCP - General  REFERRING PROVIDER: Marguerita Beards, MD   REFERRING DIAG:  N39.3 (ICD-10-CM) - SUI (stress urinary incontinence, female)  N32.81 (ICD-10-CM) - Overactive bladder  N81.10 (ICD-10-CM) - Prolapse of anterior vaginal wall    THERAPY DIAG:  Other  muscle spasm  Other lack of coordination  Muscle weakness (generalized)  Rationale for Evaluation and Treatment: Rehabilitation  ONSET DATE: 09/2022 when it got worse  SUBJECTIVE:    04/12/23  Pt reports that she is actually feeling better, feels like the exercises are helping. She is surprised. Back is hurting and her knee is hurting, has had a lot of stress at works and family members in the hispital.  PAIN:  Are you having pain? Yes NPRS scale: 7/10 with Advil  Pain location:  low back  Pain type: aching, shooting, right leg, right LOWER EXTREMITY is turning in and it's bothering her Pain description: intermittent   Aggravating factors: walking, moving, being on her feet a lot Relieving factors: rest, heat  PRECAUTIONS: Other:    RED FLAGS: None   WEIGHT BEARING RESTRICTIONS: No  FALLS:  Has patient fallen in last 6 months? No  LIVING ENVIRONMENT: Lives with: lives with their family and lives alone Lives in: House/apartment OCCUPATION: nurse   PLOF: Independent  PATIENT GOALS: wants to be able to be herself, not feel like she has to  monitor herself, get her back and her leg straight  PERTINENT HISTORY:  Chronic back pain, right le weakness Sexual abuse: No  BOWEL MOVEMENT: Pain with bowel movement: No Type of bowel movement:Type (Bristol Stool Scale) 3 Fully empty rectum: Yes: after colonscopy Leakage: No Pads: No Fiber supplement: No makes her constipated  URINATION: Pain with urination: Yes Fully empty bladder: No leaks when she stands up Stream: Strong depends how bad she has to go Urgency: Yes:   Frequency: on weekends can go to bathroom faster, holds it long time, drinks more liquids Leakage: Urge to void, Walking to the bathroom, Coughing, Sneezing, Laughing,  Exercise, Lifting, and Bending forward Pads: Yes: 2 maxipads at work , has to [employer] them. Can be soaked. She just can't go to the bathroom, has some over flow   PREGNANCY: Vaginal deliveries 0  C-section deliveries 2 Currently pregnant No  PROLAPSE: Check with internal   OBJECTIVE:   DIAGNOSTIC FINDINGS:  Weakness and pain bilat hips Difficulty with walking and mobility Low back aggravated with sciatica today- limits eval Able to engage abdomen and reported pelvic floor lift with knack and contraction    COGNITION: Overall cognitive status: Within functional limits for tasks assessed     SENSATION: Light touch: Appears intact Proprioception: Appears intact   LUMBAR SPECIAL TESTS:  Straight leg raise test: Positive  POSTURE: rounded shoulders and increased lumbar lordosis  PELVIC ALIGNMENT:     LOWER EXTREMITY ROM:  Passive ROM Right eval Left eval  Hip flexion [facility]  Hip extension    Hip abduction    Hip adduction    Hip internal rotation    Hip external rotation    Knee flexion    Knee extension    Ankle dorsiflexion    Ankle plantarflexion    Ankle inversion    Ankle eversion     (Blank rows = not tested)  LOWER EXTREMITY MMT:  MMT Right eval Left eval  Hip flexion 3/5 4/5  Hip extension    Hip abduction    Hip adduction    Hip internal rotation    Hip external rotation    Knee flexion    Knee extension    Ankle dorsiflexion    Ankle plantarflexion    Ankle inversion    Ankle eversion      Patient confirms identification and approves PT to assess internal pelvic floor and treatment Yes  PELVIC MMT:   MMT eval  Vaginal 2/5  Internal Anal Sphincter   External Anal Sphincter   Puborectalis   Diastasis Recti no  (Blank rows = not tested)        TODAY'S TREATMENT:  DATE: 04/12/23      Neuromuscular  re-education: diaphragmatic breathing education- supine with bent knees, thera band, ball press    Therapeutic activities: timed voiding, education on core and pelvic floor cocontraction/ abdominal pressures       PATIENT EDUCATION:  Education details: relevant anatomy, urge suppression techniques, the knack  Person educated: Patient Education method: Explanation, Demonstration, Tactile cues, Verbal cues, and Handouts Education comprehension: verbalized understanding and needs further education  HOME EXERCISE PROGRAM: [id number]  ASSESSMENT: 04/12/23   CLINICAL IMPRESSION:  Pt with some difficulty today with awareness of breath and abdominal and pelvic floor contraction, tends to demonstrate upper chest breathing and required VC's, TC's. Added DB to HEP.    OBJECTIVE IMPAIRMENTS: decreased coordination, decreased mobility, difficulty walking, decreased ROM, decreased strength, hypomobility, impaired sensation, obesity, and pain.   ACTIVITY LIMITATIONS: standing and transfers  PARTICIPATION LIMITATIONS: occupation  PERSONAL FACTORS: Age, Fitness, and Time since onset of injury/illness/exacerbation are also affecting patient's functional outcome.   REHAB POTENTIAL: Good  CLINICAL DECISION MAKING: Stable/uncomplicated  EVALUATION COMPLEXITY: Low   GOALS: Goals reviewed with patient? Yes  SHORT TERM GOALS: Target date: 04/26/2023    Pt will be independent with the knack, urge suppression technique, and double voiding in order to improve bladder habits and decrease urinary incontinence.   Baseline: Goal status: INITIAL  2.  Pt to demonstrate improved coordination of pelvic floor and breathing mechanics with 10# squat with appropriate synergistic patterns to decrease pain and leakage at least 75% of the time.    Baseline:  Goal status: INITIAL  3.  Pt will be independent with HEP.   Baseline:  Goal status: INITIAL  LONG TERM GOALS: Target date: 06/21/2023    Pt  will be independent with advanced HEP.   Baseline:  Goal status: INITIAL  2.  Pt will demonstrate 4/5 pelvic floor muscle strength with appropriate coordination in order to decrease urinary incontinence, urgency and frequency.   Baseline:  Goal status: INITIAL  3.  Patient will use the restroom at work more then 1 in 12 hrs Baseline:  Goal status: INITIAL   PLAN:  PT FREQUENCY: 1x/week  PT DURATION: 12 weeks  PLANNED INTERVENTIONS: Therapeutic exercises, Therapeutic activity, Neuromuscular re-education, Balance training, Gait training, Patient/Family education, Self Care, Joint mobilization, Dry Needling, Electrical stimulation, Spinal manipulation, Spinal mobilization, scar mobilization, Biofeedback, and Manual therapy  PLAN FOR NEXT SESSION: internal   Calvyn Kurtzman, PT 04/12/2023, 8:33 AM

## 2023-04-12 NOTE — Therapy (Signed)
OUTPATIENT PHYSICAL THERAPY TREATMENT NOTE   Patient Name: Anne [name] MRN: [id number] DOB:1955-08-09, 67 y.o., female Today's Date: 04/12/2023  END OF SESSION:   PT End of Session - 04/12/23 1258     Visit Number 5    Date for PT Re-Evaluation 05/25/23    Progress Note Due on Visit 10    PT Start Time 1152    PT Stop Time 1236    PT Time Calculation (min) 44 min    Activity Tolerance Patient tolerated treatment well;No increased pain    Behavior During Therapy WFL for tasks assessed/performed             Past Medical History:  Diagnosis Date   Arthritis of knee    right knee   Hyperlipidemia    Hypertension    followed by pcp  and cardiology (dr h. Katrinka Blazing--- coronary CT 09-01-2020  calcium score zero and mild prox LAD calcification)   PMB (postmenopausal bleeding)    Pre-diabetes    followed by pcp   Prediabetes    Past Surgical History:  Procedure Laterality Date   CESAREAN SECTION  1988   COLONOSCOPY     DILATATION & CURETTAGE/HYSTEROSCOPY WITH MYOSURE N/A 10/22/2020   Procedure: DILATATION & CURETTAGE/HYSTEROSCOPY WITH MYOSURE;  Surgeon: Maxie Better, MD;  Location: [facility];  Service: Gynecology;  Laterality: N/A;   Patient Active Problem List   Diagnosis Date Noted   Primary osteoarthritis of right knee 03/20/2023   Chronic pain of right knee 03/20/2023   Prediabetes 09/09/2019   Vitamin D insufficiency 09/09/2019   Class 2 severe obesity due to excess calories with serious comorbidity and body mass index (BMI) of 36.0 to 36.9 in adult (HCC) 09/08/2019   HTN, goal below 130/80 09/08/2019   Chronic low back pain 05/29/2019     THERAPY DIAG:  Chronic pain of right knee  Abnormal posture  Radiculopathy, lumbar region  Other low back pain  Difficulty in walking, not elsewhere classified   PCP: Aliene Beams, MD   REFERRING PROVIDER: Tarry Kos, MD   REFERRING DIAG:  Diagnosis  M25.561,G89.29 (ICD-10-CM) - Chronic  pain of right knee  M54.41,G89.29 (ICD-10-CM) - Chronic right-sided low back pain with right-sided sciatica  M17.11 (ICD-10-CM) - Primary osteoarthritis of right knee      Rationale for Evaluation and Treatment: Rehabilitation   THERAPY DIAG:  Abnormal posture - Plan: PT plan of care cert/re-cert   Radiculopathy, lumbar region - Plan: PT plan of care cert/re-cert   Other low back pain - Plan: PT plan of care cert/re-cert   Difficulty in walking, not elsewhere classified - Plan: PT plan of care cert/re-cert   Chronic pain of right knee - Plan: PT plan of care cert/re-cert   ONSET DATE: Anne [name] notes a 6 month history of low back, right thigh and right knee pain.   SUBJECTIVE:  SUBJECTIVE STATEMENT: Anne [name] reports early HEP compliance.  She has a 6 month history of low back, right thigh and right knee pain.  Her left knee has been flared-up recently as well.  She is hoping to retire soon and wants to return to walking and be an active participant in taking care of her grandchild.   PERTINENT HISTORY:  Severe Rt knee OA, HLD, HTN, pre-diabetes   PAIN:  Are you having pain? Yes: NPRS scale: 4-6/10 this week Pain location: Low back, right thigh and right > left knee Pain description: Ache, sore Aggravating factors: Work, movement Relieving factors: Rest, heat, over the counter pain medication   PRECAUTIONS: Back   RED FLAGS: None      WEIGHT BEARING RESTRICTIONS: No   FALLS:  Has patient fallen in last 6 months? 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   NEXT MD VISIT: NA   OBJECTIVE:    DIAGNOSTIC FINDINGS:  Right knee: X-rays demonstrate severe osteoarthritis.  Bone-on-bone joint space  narrowing with valgus deformity   Low back: X-rays of the lumbar spine show diffuse degenerative changes including a  degenerative L4-5 spondylolisthesis grade 2   PATIENT SURVEYS:  FOTO 36 (Goal 46 in 14 visits)   SCREENING FOR RED FLAGS: Bowel or bladder incontinence: No Spinal tumors: No Cauda equina syndrome: No Compression fracture: No Abdominal aneurysm: No   COGNITION: Overall cognitive status: Within functional limits for tasks assessed                          SENSATION: Anne [name] notes daily right sciatica as distal as the right knee   MUSCLE LENGTH: Hamstrings: Right 50 deg; Left 50 deg   POSTURE: rounded shoulders, forward head, and decreased lumbar lordosis     LUMBAR ROM:    AROM 03/30/2023  Flexion    Extension 0  Right lateral flexion    Left lateral flexion    Right rotation    Left rotation     (Blank rows = not tested)   LOWER EXTREMITY ROM:      Passive  Left/Right 03/30/2023    Hip flexion 80/80    Hip extension      Hip abduction      Hip adduction      Hip internal rotation 4/8    Hip external rotation 20/20    Knee flexion 112/116    Knee extension 0/0    Ankle dorsiflexion      Ankle plantarflexion      Ankle inversion      Ankle eversion       (Blank rows = not tested)   LOWER EXTREMITY STRENGTH:  Deferred at evaluation secondary to time   MMT Left/Right 03/30/2023    Hip flexion      Hip extension      Hip abduction      Hip adduction      Hip internal rotation      Hip external rotation      Knee flexion      Knee extension      Ankle dorsiflexion      Ankle plantarflexion  Ankle inversion      Ankle eversion       (Blank rows = not tested)     GAIT: Distance walked: 50 feet Assistive device utilized:  None Level of assistance: Complete Independence Comments: Mild flexed posture and valgus right knee noted   TODAY'S TREATMENT:                                                                                                                              DATE:  04/12/2023 Lumbar extension AROM 10 x 3 seconds Scapular retraction/Shoulder blade pinches 10 x 5 seconds Quadriceps sets with pillow below knees 10 x 5 seconds Hip hike  Seated straight leg raises 3 sets of 5 for 3 seconds Hip hike at counter top 10 x 3 seconds   Functional Activities: Log roll, review of imaging, review of HEP, education on spine posture, avoiding knee hyper-extension and HEP  Double Leg Press 100# 10 x slow eccentrics Single Leg Press 50# 10 x each side slow eccentrics   03/30/2023 Lumbar extension AROM 10 x 3 seconds Scapular retraction/Shoulder blade pinches 10 x 5 seconds Quadriceps sets with pillow below knees 10 x 5 seconds   Functional Activities: Log roll, spine and knee anatomy with the model, review of imaging, review of exam findings, education on spine posture, avoiding knee hyper-extension and HEP      PATIENT EDUCATION:  Education details: See above Person educated: Patient Education method: Explanation, Demonstration, Tactile cues, Verbal cues, and Handouts Education comprehension: verbalized understanding, returned demonstration, verbal cues required, tactile cues required, and needs further education   HOME EXERCISE PROGRAM: [id number]   ASSESSMENT:   CLINICAL IMPRESSION: Troy has a 82-month history of low back, right sciatica to the knee and bilateral knee pain.  She works 3 12-hour shifts on her feet and would like to retire next year.  We reviewed the importance of avoiding knee hyperextension and flexed postures.  We reviewed and progressed her comprehensive home rehabilitation program to address impairments noted at evaluation in regards to posture, flexibility, strength and body  mechanics.  Her prognosis to meet the below listed goals remains good with the recommended plan of care.   OBJECTIVE IMPAIRMENTS: Abnormal gait, decreased activity tolerance, decreased endurance, decreased knowledge of condition, difficulty walking, decreased ROM, decreased strength, decreased safety awareness, increased edema, impaired perceived functional ability, increased muscle spasms, impaired flexibility, improper body mechanics, postural dysfunction, obesity, and pain.    ACTIVITY LIMITATIONS: lifting, bending, standing, squatting, stairs, and locomotion level   PARTICIPATION LIMITATIONS: interpersonal relationship, community activity, and occupation   PERSONAL FACTORS: Severe Rt knee OA, HLD, HTN, pre-diabetes are also affecting patient's functional outcome.    REHAB POTENTIAL: Good   CLINICAL DECISION MAKING: Stable/uncomplicated   EVALUATION COMPLEXITY: Moderate     GOALS: Goals reviewed with patient? Yes   SHORT TERM GOALS: Target date: 04/26/2024   Shaquasia will be independent with her day 1 HEP Baseline: Started 03/29/2023 Goal  status: On Going 04/12/2023   2.  Improve lumbar extension active range of motion to at least 5 degrees Baseline: 0 degrees Goal status: INITIAL   3.  Improve bilateral hip flexion active range of motion to at least 90 degrees and external rotation to at least 30 degrees Baseline: 80 degrees and 20 degrees respectively Goal status: INITIAL     LONG TERM GOALS: Target date: 05/25/2023   Improve FOTO to 46 Baseline: 36 Goal status: INITIAL   2.  Aliany will report low back and bilateral knee pain pain consistently 0-3/10 on the Numeric Pain Rating Scale Baseline: 4-6/10 Goal status: INITIAL   3.  Improve bilateral quadriceps and spine strength as assessed by FOTO scores and objective measures Baseline: FOTO 36, objective measures deferred secondary to time at evaluation Goal status: INITIAL   4.  Hortence will report improved  weight-bearing function at home at work as a result of improved back and knee strength and pain Baseline: Limited with weight-bearing function at home and work Goal status: INITIAL   5. Ernestene will be independent with her long-term maintenance HEP at [place]. Baseline: Started 03/30/2023 Goal status: INITIAL   PLAN:   PT FREQUENCY: 1-2x/week   PT DURATION: 8 weeks   PLANNED INTERVENTIONS: Therapeutic exercises, Therapeutic activity, Neuromuscular re-education, Balance training, Gait training, Patient/Family education, Self Care, Stair training, Dry Needling, Cryotherapy, Traction, and Manual therapy.   PLAN FOR NEXT SESSION: Get objective strength measurements for quadriceps and low back strength (when appropriate).  Review her home exercise program with progressions to quadriceps strengthening, postural awareness, postural strength and body mechanics.  Prone hip extension next visit.     Cherlyn Cushing, PT, MPT 04/12/2023, 1:03 PM

## 2023-04-18 ENCOUNTER — Encounter: Payer: Self-pay | Admitting: Rehabilitative and Restorative Service Providers"

## 2023-04-18 ENCOUNTER — Ambulatory Visit (INDEPENDENT_AMBULATORY_CARE_PROVIDER_SITE_OTHER): Payer: 59 | Admitting: Rehabilitative and Restorative Service Providers"

## 2023-04-18 DIAGNOSIS — G8929 Other chronic pain: Secondary | ICD-10-CM

## 2023-04-18 DIAGNOSIS — M5416 Radiculopathy, lumbar region: Secondary | ICD-10-CM

## 2023-04-18 DIAGNOSIS — M25561 Pain in right knee: Secondary | ICD-10-CM | POA: Diagnosis not present

## 2023-04-18 DIAGNOSIS — R293 Abnormal posture: Secondary | ICD-10-CM | POA: Diagnosis not present

## 2023-04-18 DIAGNOSIS — M5459 Other low back pain: Secondary | ICD-10-CM

## 2023-04-18 DIAGNOSIS — R262 Difficulty in walking, not elsewhere classified: Secondary | ICD-10-CM

## 2023-04-18 NOTE — Therapy (Signed)
OUTPATIENT PHYSICAL THERAPY TREATMENT NOTE   Patient Name: Anne [name] MRN: [id number] DOB:[birth year], 67 y.o., female Today's Date: 04/18/2023  END OF SESSION:   PT End of Session - 04/18/23 0934     Visit Number 6    Date for PT Re-Evaluation 05/25/23    Authorization Type [employer] Save/Medicare secondary    Progress Note Due on Visit 10    PT Start Time 0930    PT Stop Time 1009    PT Time Calculation (min) 39 min    Activity Tolerance Patient limited by pain    Behavior During Therapy [facility] for tasks assessed/performed              Past Medical History:  Diagnosis Date   Arthritis of knee    right knee   Hyperlipidemia    Hypertension    followed by pcp  and cardiology (dr h. Katrinka Blazing--- coronary CT 09-01-2020  calcium score zero and mild prox LAD calcification)   PMB (postmenopausal bleeding)    Pre-diabetes    followed by pcp   Prediabetes    Past Surgical History:  Procedure Laterality Date   CESAREAN SECTION  1988   COLONOSCOPY     DILATATION & CURETTAGE/HYSTEROSCOPY WITH MYOSURE N/A 10/22/2020   Procedure: DILATATION & CURETTAGE/HYSTEROSCOPY WITH MYOSURE;  Surgeon: Maxie Better, MD;  Location: [facility];  Service: Gynecology;  Laterality: N/A;   Patient Active Problem List   Diagnosis Date Noted   Primary osteoarthritis of right knee 03/20/2023   Chronic pain of right knee 03/20/2023   Prediabetes 09/09/2019   Vitamin D insufficiency 09/09/2019   Class 2 severe obesity due to excess calories with serious comorbidity and body mass index (BMI) of 36.0 to 36.9 in adult (HCC) 09/08/2019   HTN, goal below 130/80 09/08/2019   Chronic low back pain 05/29/2019     THERAPY DIAG:  Abnormal posture  Radiculopathy, lumbar region  Other low back pain  Difficulty in walking, not elsewhere classified  Chronic pain of right knee   PCP: Aliene Beams, MD   REFERRING PROVIDER: Tarry Kos, MD   REFERRING DIAG:  Diagnosis   M25.561,G89.29 (ICD-10-CM) - Chronic pain of right knee  M54.41,G89.29 (ICD-10-CM) - Chronic right-sided low back pain with right-sided sciatica  M17.11 (ICD-10-CM) - Primary osteoarthritis of right knee      Rationale for Evaluation and Treatment: Rehabilitation   THERAPY DIAG:  Abnormal posture - Plan: PT plan of care cert/re-cert   Radiculopathy, lumbar region - Plan: PT plan of care cert/re-cert   Other low back pain - Plan: PT plan of care cert/re-cert   Difficulty in walking, not elsewhere classified - Plan: PT plan of care cert/re-cert   Chronic pain of right knee - Plan: PT plan of care cert/re-cert   ONSET DATE: Shannan notes a 6 month history of low back, right thigh and right knee pain.   SUBJECTIVE:  SUBJECTIVE STATEMENT: Pt indicated having difficulty in walking today with Rt hurting more than Lt.  Pt indicated more aggravation with walking.    PERTINENT HISTORY:  Severe Rt knee OA, HLD, HTN, pre-diabetes   PAIN:  Yes: NPRS scale: 6/10 Rt knee, a little in Lt knee  Pain location: Low back, right thigh and right > left knee Pain description: Ache, sore Aggravating factors: Work, movement Relieving factors: Rest, heat, over the counter pain medication   PRECAUTIONS: Back   RED FLAGS: None      WEIGHT BEARING RESTRICTIONS: No   FALLS:  Has patient fallen in last 6 months? 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   NEXT MD VISIT: NA   OBJECTIVE:    DIAGNOSTIC FINDINGS:   Right knee: X-rays demonstrate severe osteoarthritis.  Bone-on-bone joint space  narrowing with valgus deformity   Low back: X-rays of the lumbar spine show diffuse degenerative changes including a  degenerative L4-5 spondylolisthesis grade 2   PATIENT SURVEYS:  FOTO 36 (Goal 46 in 14 visits)   SCREENING FOR RED FLAGS: Bowel or bladder incontinence: No Spinal tumors: No Cauda equina syndrome: No Compression fracture: No Abdominal aneurysm: No   COGNITION: Overall cognitive status: Within functional limits for tasks assessed                          SENSATION: Nuha notes daily right sciatica as distal as the right knee   MUSCLE LENGTH: Hamstrings: Right 50 deg; Left 50 deg   POSTURE: rounded shoulders, forward head, and decreased lumbar lordosis     LUMBAR ROM:    AROM 03/30/2023  Flexion    Extension 0  Right lateral flexion    Left lateral flexion    Right rotation    Left rotation     (Blank rows = not tested)   LOWER EXTREMITY ROM:      Passive  Left/Right 03/30/2023    Hip flexion 80/80    Hip extension      Hip abduction      Hip adduction      Hip internal rotation 4/8    Hip external rotation 20/20    Knee flexion 112/116    Knee extension 0/0    Ankle dorsiflexion      Ankle plantarflexion      Ankle inversion      Ankle eversion       (Blank rows = not tested)   LOWER EXTREMITY STRENGTH:  Deferred at evaluation secondary to time   MMT Left/Right 03/30/2023    Hip flexion      Hip extension      Hip abduction      Hip adduction      Hip internal rotation      Hip external rotation      Knee flexion      Knee extension      Ankle dorsiflexion      Ankle plantarflexion      Ankle inversion      Ankle eversion       (Blank rows = not tested)     GAIT: 04/18/2023:  Independent  ambulation with noted antalgic gait with deviation of Rt leg stance with hyperextension/valgus noted on both knees but Rt more than Lt.     Eval: Distance  walked: 50 feet Assistive device utilized: None Level of assistance: Complete Independence Comments: Mild flexed posture and valgus right knee noted  TODAY'S TREATMENT:                                                                   DATE:  04/18/2023 Therex   Recumbent bike seat 6 lvl 1  Leg press double leg 100 lbs x 15 , single leg x 15 bilateral 50 lbs  Seated quad set 5 sec hold x 10, performed bilaterally Seated quad set c SLR 2 x 10 bilaterally  Sit to stand to sit 18 inch chair with foam mat x 10 with focus on limiting hand use with slow lowering.      TODAY'S TREATMENT:                                                                   DATE: 04/12/2023 Lumbar extension AROM 10 x 3 seconds Scapular retraction/Shoulder blade pinches 10 x 5 seconds Quadriceps sets with pillow below knees 10 x 5 seconds Hip hike  Seated straight leg raises 3 sets of 5 for 3 seconds Hip hike at counter top 10 x 3 seconds   Functional Activities: Log roll, review of imaging, review of HEP, education on spine posture, avoiding knee hyper-extension and HEP  Double Leg Press 100# 10 x slow eccentrics Single Leg Press 50# 10 x each side slow eccentrics   TODAY'S TREATMENT:                                                                   DATE: 03/30/2023 Lumbar extension AROM 10 x 3 seconds Scapular retraction/Shoulder blade pinches 10 x 5 seconds Quadriceps sets with pillow below knees 10 x 5 seconds   Functional Activities: Log roll, spine and knee anatomy with the model, review of imaging, review of exam findings, education on spine posture, avoiding knee hyper-extension and HEP      PATIENT EDUCATION:  Education details: See above Person educated: Patient Education method: Explanation, Demonstration, Tactile cues, Verbal cues, and Handouts Education comprehension: verbalized understanding, returned demonstration, verbal cues required, tactile cues required, and needs further education    HOME EXERCISE PROGRAM: [id number]   ASSESSMENT:   CLINICAL IMPRESSION: Ambulation in clinic impacted by pain symptoms in Rt knee primarily today.  Adjusted activity and limited WB to avoid exacerbation of pain symptoms in area during visit today.  Pt to continue to benefit from progressive strengthening to improve WB acceptance.    OBJECTIVE IMPAIRMENTS: Abnormal gait, decreased activity tolerance, decreased endurance, decreased knowledge of condition, difficulty walking, decreased ROM, decreased strength, decreased  safety awareness, increased edema, impaired perceived functional ability, increased muscle spasms, impaired flexibility, improper body mechanics, postural dysfunction, obesity, and pain.    ACTIVITY LIMITATIONS: lifting, bending, standing, squatting, stairs, and locomotion level   PARTICIPATION LIMITATIONS: interpersonal relationship, community activity, and occupation   PERSONAL FACTORS: Severe Rt knee OA, HLD, HTN, pre-diabetes are also affecting patient's functional outcome.    REHAB POTENTIAL: Good   CLINICAL DECISION MAKING: Stable/uncomplicated   EVALUATION COMPLEXITY: Moderate     GOALS: Goals reviewed with patient? Yes   SHORT TERM GOALS: Target date: 04/26/2024   Mackenze will be independent with her day 1 HEP Baseline: Started 03/29/2023 Goal status: On Going 04/12/2023   2.  Improve lumbar extension active range of motion to at least 5 degrees Baseline: 0 degrees Goal status: INITIAL   3.  Improve bilateral hip flexion active range of motion to at least 90 degrees and external rotation to at least 30 degrees Baseline: 80 degrees and 20 degrees respectively Goal status: INITIAL     LONG TERM GOALS: Target date: 05/25/2023   Improve FOTO to 46 Baseline: 36 Goal status: INITIAL   2.  Rindy will report low back and bilateral knee pain pain consistently 0-3/10 on the Numeric Pain Rating Scale Baseline: 4-6/10 Goal status: INITIAL   3.  Improve  bilateral quadriceps and spine strength as assessed by FOTO scores and objective measures Baseline: FOTO 36, objective measures deferred secondary to time at evaluation Goal status: INITIAL   4.  Jolly will report improved weight-bearing function at home at work as a result of improved back and knee strength and pain Baseline: Limited with weight-bearing function at home and work Goal status: INITIAL   5. Shailah will be independent with her long-term maintenance HEP at [place]. Baseline: Started 03/30/2023 Goal status: INITIAL   PLAN:   PT FREQUENCY: 1-2x/week   PT DURATION: 8 weeks   PLANNED INTERVENTIONS: Therapeutic exercises, Therapeutic activity, Neuromuscular re-education, Balance training, Gait training, Patient/Family education, Self Care, Stair training, Dry Needling, Cryotherapy, Traction, and Manual therapy.   PLAN FOR NEXT SESSION: Resume WB activity as tolerated.  Check LE strength.     Chyrel Masson, PT, DPT, OCS, ATC 04/18/23  10:06 AM

## 2023-04-19 ENCOUNTER — Ambulatory Visit: Payer: 59 | Admitting: Physical Therapy

## 2023-04-20 ENCOUNTER — Ambulatory Visit (INDEPENDENT_AMBULATORY_CARE_PROVIDER_SITE_OTHER): Payer: 59 | Admitting: Rehabilitative and Restorative Service Providers"

## 2023-04-20 ENCOUNTER — Encounter: Payer: Self-pay | Admitting: Rehabilitative and Restorative Service Providers"

## 2023-04-20 DIAGNOSIS — G8929 Other chronic pain: Secondary | ICD-10-CM | POA: Diagnosis not present

## 2023-04-20 DIAGNOSIS — M5459 Other low back pain: Secondary | ICD-10-CM | POA: Diagnosis not present

## 2023-04-20 DIAGNOSIS — M5416 Radiculopathy, lumbar region: Secondary | ICD-10-CM | POA: Diagnosis not present

## 2023-04-20 DIAGNOSIS — M25561 Pain in right knee: Secondary | ICD-10-CM | POA: Diagnosis not present

## 2023-04-20 DIAGNOSIS — R262 Difficulty in walking, not elsewhere classified: Secondary | ICD-10-CM

## 2023-04-20 DIAGNOSIS — R293 Abnormal posture: Secondary | ICD-10-CM

## 2023-04-20 NOTE — Therapy (Signed)
OUTPATIENT PHYSICAL THERAPY TREATMENT NOTE   Patient Name: Anne [name] MRN: [id number] DOB:01-Aug-1955, 67 y.o., female Today's Date: 04/20/2023  END OF SESSION:   PT End of Session - 04/20/23 0843     Visit Number 7    Date for PT Re-Evaluation 05/25/23    Authorization Type AETNA Save/Medicare secondary    Progress Note Due on Visit 10    PT Start Time 0853    PT Stop Time 0941    PT Time Calculation (min) 48 min    Activity Tolerance Patient limited by pain;Patient tolerated treatment well;No increased pain    Behavior During Therapy WFL for tasks assessed/performed              Past Medical History:  Diagnosis Date   Arthritis of knee    right knee   Hyperlipidemia    Hypertension    followed by pcp  and cardiology (dr h. Katrinka Blazing--- coronary CT 09-01-2020  calcium score zero and mild prox LAD calcification)   PMB (postmenopausal bleeding)    Pre-diabetes    followed by pcp   Prediabetes    Past Surgical History:  Procedure Laterality Date   CESAREAN SECTION  1988   COLONOSCOPY     DILATATION & CURETTAGE/HYSTEROSCOPY WITH MYOSURE N/A 10/22/2020   Procedure: DILATATION & CURETTAGE/HYSTEROSCOPY WITH MYOSURE;  Surgeon: Maxie Better, MD;  Location: [facility];  Service: Gynecology;  Laterality: N/A;   Patient Active Problem List   Diagnosis Date Noted   Primary osteoarthritis of right knee 03/20/2023   Chronic pain of right knee 03/20/2023   Prediabetes 09/09/2019   Vitamin D insufficiency 09/09/2019   Class 2 severe obesity due to excess calories with serious comorbidity and body mass index (BMI) of 36.0 to 36.9 in adult (HCC) 09/08/2019   HTN, goal below 130/80 09/08/2019   Chronic low back pain 05/29/2019     THERAPY DIAG:  Abnormal posture  Radiculopathy, lumbar region  Other low back pain  Difficulty in walking, not elsewhere classified  Chronic pain of right knee   PCP: Aliene Beams, MD   REFERRING PROVIDER: Tarry Kos, MD   REFERRING DIAG:  Diagnosis  M25.561,G89.29 (ICD-10-CM) - Chronic pain of right knee  M54.41,G89.29 (ICD-10-CM) - Chronic right-sided low back pain with right-sided sciatica  M17.11 (ICD-10-CM) - Primary osteoarthritis of right knee      Rationale for Evaluation and Treatment: Rehabilitation   THERAPY DIAG:  Abnormal posture - Plan: PT plan of care cert/re-cert   Radiculopathy, lumbar region - Plan: PT plan of care cert/re-cert   Other low back pain - Plan: PT plan of care cert/re-cert   Difficulty in walking, not elsewhere classified - Plan: PT plan of care cert/re-cert   Chronic pain of right knee - Plan: PT plan of care cert/re-cert   ONSET DATE: Anne [name] notes a 6 month history of low back, right thigh and right knee pain.   SUBJECTIVE:  SUBJECTIVE STATEMENT: Anne [name] notes that her knees have been very painful the last few days with the significantly colder weather.  She is still working on avoiding knee hyper-extension with standing.   PERTINENT HISTORY:  Severe Rt knee OA, HLD, HTN, pre-diabetes   PAIN:  Yes: NPRS scale: 4-6/10 bilateral knees, low back and right sciatica to the lateral thigh Pain location: Low back, right thigh and right > left knee Pain description: Ache, sore Aggravating factors: Work, movement Relieving factors: Rest, heat, over the counter pain medication   PRECAUTIONS: Back   RED FLAGS: None      WEIGHT BEARING RESTRICTIONS: No   FALLS:  Has patient fallen in last 6 months? 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   NEXT MD VISIT: NA   OBJECTIVE:    DIAGNOSTIC FINDINGS:  Right knee: X-rays demonstrate severe osteoarthritis.  Bone-on-bone joint space  narrowing with valgus deformity   Low back: X-rays of the lumbar spine show diffuse degenerative changes including a  degenerative L4-5 spondylolisthesis grade 2   PATIENT SURVEYS:  FOTO 36 (Goal 46 in 14 visits)   SCREENING FOR RED FLAGS: Bowel or bladder incontinence: No Spinal tumors: No Cauda equina syndrome: No Compression fracture: No Abdominal aneurysm: No   COGNITION: Overall cognitive status: Within functional limits for tasks assessed                          SENSATION: Anne [name] notes daily right sciatica as distal as the right knee   MUSCLE LENGTH: Hamstrings: Right 50 deg; Left 50 deg   POSTURE: rounded shoulders, forward head, and decreased lumbar lordosis     LUMBAR ROM:    AROM 03/30/2023  Flexion    Extension 0  Right lateral flexion    Left lateral flexion    Right rotation    Left rotation     (Blank rows = not tested)   LOWER EXTREMITY ROM:      Passive  Left/Right 03/30/2023    Hip flexion 80/80    Hip extension      Hip abduction      Hip adduction      Hip internal rotation 4/8    Hip external rotation 20/20    Knee flexion 112/116    Knee extension 0/0    Ankle dorsiflexion      Ankle plantarflexion      Ankle inversion      Ankle eversion       (Blank rows = not tested)   LOWER EXTREMITY STRENGTH:  Deferred at evaluation secondary to time   MMT Left/Right 03/30/2023    Hip flexion      Hip extension      Hip abduction      Hip adduction      Hip internal rotation      Hip external rotation      Knee flexion      Knee extension      Ankle dorsiflexion      Ankle plantarflexion      Ankle inversion      Ankle eversion       (Blank rows =  not tested)     GAIT: 04/18/2023:  Independent ambulation with noted antalgic gait  with deviation of Rt leg stance with hyperextension/valgus noted on both knees but Rt more than Lt.     Eval: Distance walked: 50 feet Assistive device utilized: None Level of assistance: Complete Independence Comments: Mild flexed posture and valgus right knee noted  TODAY'S TREATMENT:                                                                   DATE:   04/20/2023 Lumbar extension AROM 10 x 3 seconds Scapular retraction/Shoulder blade pinches 10 x 5 seconds Quadriceps sets with pillow below knees 10 x 5 seconds Hip hike  Seated straight leg raises 3 sets of 5 for 3 seconds with 1# Hip hike at counter top and in door frame 10 x 3 seconds each, work on avoiding knee hyper-extension and lateral lean   Functional Activities: Log roll, review of knee imaging, review the importance of spine posture, avoiding knee hyper-extension and HEP    04/18/2023 Therex   Recumbent bike seat 6 lvl 1  Leg press double leg 100 lbs x 15 , single leg x 15 bilateral 50 lbs  Seated quad set 5 sec hold x 10, performed bilaterally Seated quad set c SLR 2 x 10 bilaterally  Sit to stand to sit 18 inch chair with foam mat x 10 with focus on limiting hand use with slow lowering.    04/12/2023 Lumbar extension AROM 10 x 3 seconds Scapular retraction/Shoulder blade pinches 10 x 5 seconds Quadriceps sets with pillow below knees 10 x 5 seconds Hip hike  Seated straight leg raises 3 sets of 5 for 3 seconds Hip hike at counter top 10 x 3 seconds   Functional Activities: Log roll, review of imaging, review of HEP, education on spine posture, avoiding knee hyper-extension and HEP  Double Leg Press 100# 10 x slow eccentrics Single Leg Press 50# 10 x each side slow eccentrics    PATIENT EDUCATION:  Education details: See above Person educated: Patient Education method: Explanation, Demonstration, Tactile cues, Verbal cues, and Handouts Education comprehension: verbalized understanding, returned  demonstration, verbal cues required, tactile cues required, and needs further education   HOME EXERCISE PROGRAM: Access Code: [email or web address] URL: [email or web address] Date: 04/20/2023 Prepared by: Pauletta Browns  Exercises - Standing Lumbar Extension at Wall - Forearms  - 5 x daily - 7 x weekly - 1 sets - 5 reps - 3 seconds hold - Standing Scapular Retraction  - 5 x daily - 7 x weekly - 1 sets - 5 reps - 5 second hold - Supine Quadricep Sets  - 5 x daily - 7 x weekly - 2 sets - 10 reps - 5 second hold - Small Range Straight Leg Raise  - 2 x daily - 7 x weekly - 3-5 sets - 5 reps - 3 seconds hold - Standing Hip Hiking  - 3-5 x daily - 7 x weekly - 1 sets - 10 reps - 3 seconds hold   ASSESSMENT:   CLINICAL IMPRESSION: Avoiding knee hyper-extension has been difficult thus far.  We reviewed Kitty's imaging and she does have significant right > left knee OA.  The focus of her supervised and home exercises  remain quadriceps strengthening, avoiding knee hyper-extension, posture, postural, scapular and low back strength to meet long-term goals.   OBJECTIVE IMPAIRMENTS: Abnormal gait, decreased activity tolerance, decreased endurance, decreased knowledge of condition, difficulty walking, decreased ROM, decreased strength, decreased safety awareness, increased edema, impaired perceived functional ability, increased muscle spasms, impaired flexibility, improper body mechanics, postural dysfunction, obesity, and pain.    ACTIVITY LIMITATIONS: lifting, bending, standing, squatting, stairs, and locomotion level   PARTICIPATION LIMITATIONS: interpersonal relationship, community activity, and occupation   PERSONAL FACTORS: Severe Rt knee OA, HLD, HTN, pre-diabetes are also affecting patient's functional outcome.    REHAB POTENTIAL: Good   CLINICAL DECISION MAKING: Stable/uncomplicated   EVALUATION COMPLEXITY: Moderate     GOALS: Goals reviewed with patient? Yes   SHORT TERM GOALS:  Target date: 04/26/2024   Tazanna will be independent with her day 1 HEP Baseline: Started 03/29/2023 Goal status: On Going 04/20/2023   2.  Improve lumbar extension active range of motion to at least 5 degrees Baseline: 0 degrees Goal status: INITIAL   3.  Improve bilateral hip flexion active range of motion to at least 90 degrees and external rotation to at least 30 degrees Baseline: 80 degrees and 20 degrees respectively Goal status: INITIAL     LONG TERM GOALS: Target date: 05/25/2023   Improve FOTO to 46 Baseline: 36 Goal status: INITIAL   2.  Rosan will report low back and bilateral knee pain pain consistently 0-3/10 on the Numeric Pain Rating Scale Baseline: 4-6/10 Goal status: On Going 04/20/2023   3.  Improve bilateral quadriceps and spine strength as assessed by FOTO scores and objective measures Baseline: FOTO 36, objective measures deferred secondary to time at evaluation Goal status: INITIAL   4.  Shantiqua will report improved weight-bearing function at home at work as a result of improved back and knee strength and pain Baseline: Limited with weight-bearing function at home and work Goal status: INITIAL   5. Saquita will be independent with her long-term maintenance HEP at DC. Baseline: Started 03/30/2023 Goal status: INITIAL   PLAN:   PT FREQUENCY: 1-2x/week   PT DURATION: 8 weeks   PLANNED INTERVENTIONS: Therapeutic exercises, Therapeutic activity, Neuromuscular re-education, Balance training, Gait training, Patient/Family education, Self Care, Stair training, Dry Needling, Cryotherapy, Traction, and Manual therapy.   PLAN FOR NEXT SESSION: Leg Press, formal quadriceps strength measure, re-assess lumbar extension AROM.    Cherlyn Cushing PT, MPT 04/20/23  9:49 AM

## 2023-04-26 ENCOUNTER — Encounter: Payer: Self-pay | Admitting: Rehabilitative and Restorative Service Providers"

## 2023-04-26 ENCOUNTER — Ambulatory Visit: Payer: 59 | Admitting: Rehabilitative and Restorative Service Providers"

## 2023-04-26 DIAGNOSIS — M5459 Other low back pain: Secondary | ICD-10-CM

## 2023-04-26 DIAGNOSIS — G8929 Other chronic pain: Secondary | ICD-10-CM | POA: Diagnosis not present

## 2023-04-26 DIAGNOSIS — M25561 Pain in right knee: Secondary | ICD-10-CM | POA: Diagnosis not present

## 2023-04-26 DIAGNOSIS — R293 Abnormal posture: Secondary | ICD-10-CM | POA: Diagnosis not present

## 2023-04-26 DIAGNOSIS — M5416 Radiculopathy, lumbar region: Secondary | ICD-10-CM

## 2023-04-26 DIAGNOSIS — R262 Difficulty in walking, not elsewhere classified: Secondary | ICD-10-CM

## 2023-04-26 NOTE — Therapy (Signed)
OUTPATIENT PHYSICAL THERAPY TREATMENT NOTE   Patient Name: Anne [name] MRN: [id number] DOB:[birth year], 67 y.o., female Today's Date: 04/26/2023  END OF SESSION:   PT End of Session - 04/26/23 0802     Visit Number 8    Date for PT Re-Evaluation 05/25/23    Authorization Type AET[employer] Save/Medicare secondary    Progress Note Due on Visit 10    PT Start Time 0801    PT Stop Time 0841    PT Time Calculation (min) 40 min    Activity Tolerance Patient tolerated treatment well;No increased pain;Patient limited by fatigue    Behavior During Therapy [facility] for tasks assessed/performed               Past Medical History:  Diagnosis Date   Arthritis of knee    right knee   Hyperlipidemia    Hypertension    followed by pcp  and cardiology (dr h. Katrinka Blazing--- coronary CT 09-01-2020  calcium score zero and mild prox LAD calcification)   PMB (postmenopausal bleeding)    Pre-diabetes    followed by pcp   Prediabetes    Past Surgical History:  Procedure Laterality Date   CESAREAN SECTION  1988   COLONOSCOPY     DILATATION & CURETTAGE/HYSTEROSCOPY WITH MYOSURE N/A 10/22/2020   Procedure: DILATATION & CURETTAGE/HYSTEROSCOPY WITH MYOSURE;  Surgeon: Maxie Better, MD;  Location: [facility];  Service: Gynecology;  Laterality: N/A;   Patient Active Problem List   Diagnosis Date Noted   Primary osteoarthritis of right knee 03/20/2023   Chronic pain of right knee 03/20/2023   Prediabetes 09/09/2019   Vitamin D insufficiency 09/09/2019   Class 2 severe obesity due to excess calories with serious comorbidity and body mass index (BMI) of 36.0 to 36.9 in adult (HCC) 09/08/2019   HTN, goal below 130/80 09/08/2019   Chronic low back pain 05/29/2019     THERAPY DIAG:  Abnormal posture  Radiculopathy, lumbar region  Other low back pain  Difficulty in walking, not elsewhere classified  Chronic pain of right knee   PCP: Aliene Beams, MD   REFERRING PROVIDER:  Tarry Kos, MD   REFERRING DIAG:  Diagnosis  M25.561,G89.29 (ICD-10-CM) - Chronic pain of right knee  M54.41,G89.29 (ICD-10-CM) - Chronic right-sided low back pain with right-sided sciatica  M17.11 (ICD-10-CM) - Primary osteoarthritis of right knee      Rationale for Evaluation and Treatment: Rehabilitation   THERAPY DIAG:  Abnormal posture - Plan: PT plan of care cert/re-cert   Radiculopathy, lumbar region - Plan: PT plan of care cert/re-cert   Other low back pain - Plan: PT plan of care cert/re-cert   Difficulty in walking, not elsewhere classified - Plan: PT plan of care cert/re-cert   Chronic pain of right knee - Plan: PT plan of care cert/re-cert   ONSET DATE: Carisha notes a 6 month history of low back, right thigh and right knee pain.   SUBJECTIVE:  SUBJECTIVE STATEMENT: Anne [name] notes that she has been driving a lot due to her mom being in the hospital.  She reports good HEP compliance.  Her knees remain very painful.  She is still working on avoiding knee hyper-extension with standing.   PERTINENT HISTORY:  Severe Rt knee OA, HLD, HTN, pre-diabetes   PAIN:  Yes: NPRS scale: 4-6/10 bilateral knees, low back and right sciatica to the lateral thigh Pain location: Low back, right thigh and right > left knee Pain description: Ache, sore Aggravating factors: Work, movement Relieving factors: Rest, heat, over the counter pain medication   PRECAUTIONS: Back   RED FLAGS: None      WEIGHT BEARING RESTRICTIONS: No   FALLS:  Has patient fallen in last 6 months? 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   NEXT MD VISIT: NA   OBJECTIVE:    DIAGNOSTIC FINDINGS:  Right knee: X-rays demonstrate severe osteoarthritis.  Bone-on-bone joint space  narrowing with valgus deformity   Low back: X-rays of the lumbar spine show diffuse degenerative changes including a  degenerative L4-5 spondylolisthesis grade 2   PATIENT SURVEYS:  FOTO 36 (Goal 46 in 14 visits)   SCREENING FOR RED FLAGS: Bowel or bladder incontinence: No Spinal tumors: No Cauda equina syndrome: No Compression fracture: No Abdominal aneurysm: No   COGNITION: Overall cognitive status: Within functional limits for tasks assessed                          SENSATION: Anne [name] notes daily right sciatica as distal as the right knee   MUSCLE LENGTH: Hamstrings: Right 50 deg; Left 50 deg   POSTURE: rounded shoulders, forward head, and decreased lumbar lordosis     LUMBAR ROM:    AROM 03/30/2023  Flexion    Extension 0  Right lateral flexion    Left lateral flexion    Right rotation    Left rotation     (Blank rows = not tested)   LOWER EXTREMITY ROM:      Passive  Left/Right 03/30/2023    Hip flexion 80/80    Hip extension      Hip abduction      Hip adduction      Hip internal rotation 4/8    Hip external rotation 20/20    Knee flexion 112/116    Knee extension 0/0    Ankle dorsiflexion      Ankle plantarflexion      Ankle inversion      Ankle eversion       (Blank rows = not tested)   LOWER EXTREMITY STRENGTH:  Deferred at evaluation secondary to time   MMT Left/Right 03/30/2023    Hip flexion      Hip extension      Hip abduction      Hip adduction      Hip internal rotation      Hip external rotation      Knee flexion      Knee extension      Ankle dorsiflexion      Ankle plantarflexion      Ankle inversion  Ankle eversion       (Blank rows = not tested)     GAIT: 04/18/2023:   Independent ambulation with noted antalgic gait with deviation of Rt leg stance with hyperextension/valgus noted on both knees but Rt more than Lt.     Eval: Distance walked: 50 feet Assistive device utilized: None Level of assistance: Complete Independence Comments: Mild flexed posture and valgus right knee noted  TODAY'S TREATMENT:                                                                   DATE:   04/26/2023 NuStep Level 6 for 7 minutes with heat on low back Seated straight leg raises 3 sets of 5 for 3 seconds with 0# Hip hike at counter top 10 x 3 seconds each, work on avoiding knee hyper-extension and lateral lean   Functional Activities: Double leg press with emphasis on full extension and avoiding knee hyper-extension 15# 75# slow eccentrics Single leg press with emphasis on full extension and avoiding knee hyper-extension 15# 50# slow eccentrics Limited range sit to stand in parallel bars 5 x focus on hips back like sitting in a chair, 3 second hold and avoid knee hyperextension  Log roll, the importance of spine posture, avoiding knee hyper-extension and HEP    04/20/2023 Lumbar extension AROM 10 x 3 seconds Scapular retraction/Shoulder blade pinches 10 x 5 seconds Quadriceps sets with pillow below knees 10 x 5 seconds Hip hike  Seated straight leg raises 3 sets of 5 for 3 seconds with 1# Hip hike at counter top and in door frame 10 x 3 seconds each, work on avoiding knee hyper-extension and lateral lean   Functional Activities: Log roll, review of knee imaging, review the importance of spine posture, avoiding knee hyper-extension and HEP    04/18/2023 Therex   Recumbent bike seat 6 lvl 1  Leg press double leg 100 lbs x 15 , single leg x 15 bilateral 50 lbs  Seated quad set 5 sec hold x 10, performed bilaterally Seated quad set c SLR 2 x 10 bilaterally  Sit to stand to sit 18 inch chair with foam mat x 10 with focus on limiting hand use with slow lowering.     PATIENT EDUCATION:  Education details: See above Person educated: Patient Education method: Explanation, Demonstration, Tactile cues, Verbal cues, and Handouts Education comprehension: verbalized understanding, returned demonstration, verbal cues required, tactile cues required, and needs further education   HOME EXERCISE PROGRAM: Access Code: [email or web address] URL: [email or web address] Date: 04/26/2023 Prepared by: Pauletta Browns  Exercises - Standing Lumbar Extension at Wall - Forearms  - 5 x daily - 7 x weekly - 1 sets - 5 reps - 3 seconds hold - Standing Scapular Retraction  - 5 x daily - 7 x weekly - 1 sets - 5 reps - 5 second hold - Supine Quadricep Sets  - 5 x daily - 7 x weekly - 2 sets - 10 reps - 5 second hold - Small Range Straight Leg Raise  - 2 x daily - 7 x weekly - 3-5 sets - 5 reps - 3 seconds hold - Standing Hip Hiking  - 3-5 x daily - 7 x weekly - 1 sets - 10 reps - 3 seconds  hold - Mini Squats at Table  - 2 x daily - 7 x weekly - 1 sets - 5 reps  ASSESSMENT:   CLINICAL IMPRESSION: Anne [name] is having a hard time avoiding bilateral knee hyperextension with standing more than a few seconds.  We reviewed her home exercises with corrections provided as necessary.  Her prognosis to continue to get stronger, improved standing and walking endurance and improve her function is good.  Given the amount of wear and tear in her knees, Anne [name] may be a TKA candidate.  She did note low back pain and lateral thigh pain was not bothering her as much as her knees over the past week.  I will take some objective measures next week to assess progress towards long-term goals and make additional recommendations.   OBJECTIVE IMPAIRMENTS: Abnormal gait, decreased activity tolerance, decreased endurance, decreased knowledge of condition, difficulty walking, decreased ROM, decreased strength, decreased safety awareness, increased edema, impaired perceived functional ability, increased muscle  spasms, impaired flexibility, improper body mechanics, postural dysfunction, obesity, and pain.    ACTIVITY LIMITATIONS: lifting, bending, standing, squatting, stairs, and locomotion level   PARTICIPATION LIMITATIONS: interpersonal relationship, community activity, and occupation   PERSONAL FACTORS: Severe Rt knee OA, HLD, HTN, pre-diabetes are also affecting patient's functional outcome.    REHAB POTENTIAL: Good   CLINICAL DECISION MAKING: Stable/uncomplicated   EVALUATION COMPLEXITY: Moderate     GOALS: Goals reviewed with patient? Yes   SHORT TERM GOALS: Target date: 04/26/2024   Anne [name] will be independent with her day 1 HEP Baseline: Started 03/29/2023 Goal status: On Going 04/26/2023   2.  Improve lumbar extension active range of motion to at least 5 degrees Baseline: 0 degrees Goal status: INITIAL   3.  Improve bilateral hip flexion active range of motion to at least 90 degrees and external rotation to at least 30 degrees Baseline: 80 degrees and 20 degrees respectively Goal status: INITIAL     LONG TERM GOALS: Target date: 05/25/2023   Improve FOTO to 46 Baseline: 36 Goal status: INITIAL   2.  Anne [name] will report low back and bilateral knee pain pain consistently 0-3/10 on the Numeric Pain Rating Scale Baseline: 4-6/10 Goal status: On Going 04/20/2023   3.  Improve bilateral quadriceps and spine strength as assessed by FOTO scores and objective measures Baseline: FOTO 36, objective measures deferred secondary to time at evaluation Goal status: INITIAL   4.  Anne [name] will report improved weight-bearing function at home at work as a result of improved back and knee strength and pain Baseline: Limited with weight-bearing function at home and work Goal status: INITIAL   5. Anne [name] will be independent with her long-term maintenance HEP at DC. Baseline: Started 03/30/2023 Goal status: INITIAL   PLAN:   PT FREQUENCY: 1-2x/week   PT DURATION: 8 weeks   PLANNED  INTERVENTIONS: Therapeutic exercises, Therapeutic activity, Neuromuscular re-education, Balance training, Gait training, Patient/Family education, Self Care, Stair training, Dry Needling, Cryotherapy, Traction, and Manual therapy.   PLAN FOR NEXT SESSION: Progress note and FOTO.  Cherlyn Cushing PT, MPT 04/26/23  4:33 PM

## 2023-04-27 ENCOUNTER — Ambulatory Visit (INDEPENDENT_AMBULATORY_CARE_PROVIDER_SITE_OTHER): Payer: 59 | Admitting: Rehabilitative and Restorative Service Providers"

## 2023-04-27 ENCOUNTER — Encounter: Payer: Self-pay | Admitting: Rehabilitative and Restorative Service Providers"

## 2023-04-27 DIAGNOSIS — R262 Difficulty in walking, not elsewhere classified: Secondary | ICD-10-CM | POA: Diagnosis not present

## 2023-04-27 DIAGNOSIS — G8929 Other chronic pain: Secondary | ICD-10-CM

## 2023-04-27 DIAGNOSIS — R293 Abnormal posture: Secondary | ICD-10-CM | POA: Diagnosis not present

## 2023-04-27 DIAGNOSIS — M5459 Other low back pain: Secondary | ICD-10-CM

## 2023-04-27 DIAGNOSIS — M25561 Pain in right knee: Secondary | ICD-10-CM

## 2023-04-27 DIAGNOSIS — M5416 Radiculopathy, lumbar region: Secondary | ICD-10-CM | POA: Diagnosis not present

## 2023-04-27 NOTE — Therapy (Signed)
OUTPATIENT PHYSICAL THERAPY TREATMENT/PROGRESS NOTE   Patient Name: Anne [name] MRN: [id number] DOB:February 24, 1956, 67 y.o., female Today's Date: 04/27/2023  Progress Note Reporting Period 03/30/2023 to 04/27/2023  See note below for Objective Data and Assessment of Progress/Goals.     END OF SESSION:   PT End of Session - 04/27/23 0849     Visit Number 9    Date for PT Re-Evaluation 05/25/23    Authorization Type [employer] Save/Medicare secondary    Progress Note Due on Visit 19    PT Start Time 0801    PT Stop Time 0848    PT Time Calculation (min) 47 min    Activity Tolerance Patient tolerated treatment well;No increased pain    Behavior During Therapy WFL for tasks assessed/performed              Past Medical History:  Diagnosis Date   Arthritis of knee    right knee   Hyperlipidemia    Hypertension    followed by pcp  and cardiology (dr h. Katrinka Blazing--- coronary CT 09-01-2020  calcium score zero and mild prox LAD calcification)   PMB (postmenopausal bleeding)    Pre-diabetes    followed by pcp   Prediabetes    Past Surgical History:  Procedure Laterality Date   CESAREAN SECTION  1988   COLONOSCOPY     DILATATION & CURETTAGE/HYSTEROSCOPY WITH MYOSURE N/A 10/22/2020   Procedure: DILATATION & CURETTAGE/HYSTEROSCOPY WITH MYOSURE;  Surgeon: Maxie Better, MD;  Location: [facility];  Service: Gynecology;  Laterality: N/A;   Patient Active Problem List   Diagnosis Date Noted   Primary osteoarthritis of right knee 03/20/2023   Chronic pain of right knee 03/20/2023   Prediabetes 09/09/2019   Vitamin D insufficiency 09/09/2019   Class 2 severe obesity due to excess calories with serious comorbidity and body mass index (BMI) of 36.0 to 36.9 in adult (HCC) 09/08/2019   HTN, goal below 130/80 09/08/2019   Chronic low back pain 05/29/2019     THERAPY DIAG:  Abnormal posture  Radiculopathy, lumbar region  Other low back pain  Difficulty in  walking, not elsewhere classified  Chronic pain of right knee   PCP: Aliene Beams, MD   REFERRING PROVIDER: Tarry Kos, MD   REFERRING DIAG:  Diagnosis  M25.561,G89.29 (ICD-10-CM) - Chronic pain of right knee  M54.41,G89.29 (ICD-10-CM) - Chronic right-sided low back pain with right-sided sciatica  M17.11 (ICD-10-CM) - Primary osteoarthritis of right knee      Rationale for Evaluation and Treatment: Rehabilitation   THERAPY DIAG:  Abnormal posture - Plan: PT plan of care cert/re-cert   Radiculopathy, lumbar region - Plan: PT plan of care cert/re-cert   Other low back pain - Plan: PT plan of care cert/re-cert   Difficulty in walking, not elsewhere classified - Plan: PT plan of care cert/re-cert   Chronic pain of right knee - Plan: PT plan of care cert/re-cert   ONSET DATE: Anne [name] notes a 6 month history of low back, right thigh and right knee pain.   SUBJECTIVE:  SUBJECTIVE STATEMENT: Anne [name] notes good HEP compliance, although not with all exercises.  Her knees remain very painful, particularly late in her workday.  She is still working on avoiding knee hyper-extension with standing.   PERTINENT HISTORY:  Severe Rt knee OA, HLD, HTN, pre-diabetes   PAIN:  Yes: NPRS scale: 4-6/10 bilateral knees, low back and right sciatica to the lateral thigh Pain location: Low back, right thigh and right > left knee Pain description: Ache, sore Aggravating factors: Work, movement, hyper-extension Relieving factors: Rest, heat, over the counter pain medication   PRECAUTIONS: Back   RED FLAGS: None      WEIGHT BEARING RESTRICTIONS: No   FALLS:  Has patient fallen in last 6 months? 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   NEXT MD VISIT: NA   OBJECTIVE:    DIAGNOSTIC FINDINGS:  Right knee: X-rays demonstrate severe osteoarthritis.  Bone-on-bone joint space  narrowing with valgus deformity   Low back: X-rays of the lumbar spine show diffuse degenerative changes including a  degenerative L4-5 spondylolisthesis grade 2   PATIENT SURVEYS:  04/27/2023: FOTO 42  Eval: FOTO 36 (Goal 46 in 14 visits)   SCREENING FOR RED FLAGS: Bowel or bladder incontinence: No Spinal tumors: No Cauda equina syndrome: No Compression fracture: No Abdominal aneurysm: No   COGNITION: Overall cognitive status: Within functional limits for tasks assessed                          SENSATION: Anne [name] notes daily right sciatica as distal as the right knee   MUSCLE LENGTH: 04/27/2023: Hamstrings: Right 50 deg; Left 50 deg  Eval: Hamstrings: Right 50 deg; Left 50 deg   POSTURE: rounded shoulders, forward head, and decreased lumbar lordosis     LUMBAR ROM:    AROM 03/30/2023 04/27/2023  Flexion     Extension 0 0  Right lateral flexion     Left lateral flexion     Right rotation     Left rotation      (Blank rows = not tested)   LOWER EXTREMITY ROM:      Passive  Left/Right 03/30/2023  Left/Right 04/27/2023  Hip flexion 80/80  90/90  Hip extension      Hip abduction      Hip adduction      Hip internal rotation 4/8    Hip external rotation 20/20    Knee flexion 112/116  114/117  Knee extension 0/0  0/0  Ankle dorsiflexion      Ankle plantarflexion      Ankle inversion      Ankle eversion       (Blank rows = not tested)   LOWER EXTREMITY STRENGTH:  Deferred at evaluation secondary to time   MMT Left/Right 03/30/2023  Left/Right 04/27/2023  Hip flexion      Hip extension       Hip abduction      Hip adduction      Hip internal rotation      Hip external rotation      Knee flexion      Knee extension   66.2/ 65.9  Ankle dorsiflexion  Ankle plantarflexion      Ankle inversion      Ankle eversion       (Blank rows = not tested)     GAIT: 04/18/2023:  Independent ambulation with noted antalgic gait with deviation of Rt leg stance with hyperextension/valgus noted on both knees but Rt more than Lt.     Eval: Distance walked: 50 feet Assistive device utilized: None Level of assistance: Complete Independence Comments: Mild flexed posture and valgus right knee noted   TODAY'S TREATMENT:                                                                   DATE:   04/27/2023 NuStep Level 6 for 8 minutes with heat on low back  Functional Activities: Double leg press with emphasis on full extension and avoiding knee hyper-extension 15 x 75# slow eccentrics Single leg press with emphasis on full extension and avoiding knee hyper-extension 15 x 50# slow eccentrics Reassessment, FOTO, importance of posture, continued HEP compliance and avoiding knee hyper-extension  HEP activities: -Seated straight leg raises 3 sets of 5 for 3 seconds with 0# -Hip hike at counter top 10 x 3 seconds each, work on avoiding knee hyper-extension and lateral lean -Limited range sit to stand in parallel bars 5 x focus on hips back like sitting in a chair, 3 second hold and avoid knee hyperextension   04/26/2023 NuStep Level 6 for 7 minutes with heat on low back Seated straight leg raises 3 sets of 5 for 3 seconds with 0# Hip hike at counter top 10 x 3 seconds each, work on avoiding knee hyper-extension and lateral lean   Functional Activities: Double leg press with emphasis on full extension and avoiding knee hyper-extension 15 x 75# slow eccentrics Single leg press with emphasis on full extension and avoiding knee hyper-extension 15 x 50# slow eccentrics Limited range sit to  stand in parallel bars 5 x focus on hips back like sitting in a chair, 3 second hold and avoid knee hyperextension  Log roll, the importance of spine posture, avoiding knee hyper-extension and HEP    04/20/2023 Lumbar extension AROM 10 x 3 seconds Scapular retraction/Shoulder blade pinches 10 x 5 seconds Quadriceps sets with pillow below knees 10 x 5 seconds Hip hike  Seated straight leg raises 3 sets of 5 for 3 seconds with 1# Hip hike at counter top and in door frame 10 x 3 seconds each, work on avoiding knee hyper-extension and lateral lean   Functional Activities: Log roll, review of knee imaging, review the importance of spine posture, avoiding knee hyper-extension and HEP    04/18/2023 Therex   Recumbent bike seat 6 lvl 1  Leg press double leg 100 lbs x 15 , single leg x 15 bilateral 50 lbs  Seated quad set 5 sec hold x 10, performed bilaterally Seated quad set c SLR 2 x 10 bilaterally  Sit to stand to sit 18 inch chair with foam mat x 10 with focus on limiting hand use with slow lowering.    PATIENT EDUCATION:  Education details: See above Person educated: Patient Education method: Explanation, Demonstration, Tactile cues, Verbal cues, and Handouts Education comprehension: verbalized understanding, returned demonstration, verbal cues required, tactile cues required, and needs further  education   HOME EXERCISE PROGRAM: Access Code: [email or web address] URL: [email or web address] Date: 04/26/2023 Prepared by: Pauletta Browns  Exercises - Standing Lumbar Extension at Wall - Forearms  - 5 x daily - 7 x weekly - 1 sets - 5 reps - 3 seconds hold - Standing Scapular Retraction  - 5 x daily - 7 x weekly - 1 sets - 5 reps - 5 second hold - Supine Quadricep Sets  - 5 x daily - 7 x weekly - 2 sets - 10 reps - 5 second hold - Small Range Straight Leg Raise  - 2 x daily - 7 x weekly - 3-5 sets - 5 reps - 3 seconds hold - Standing Hip Hiking  - 3-5 x daily - 7 x weekly - 1 sets -  10 reps - 3 seconds hold - Mini Squats at Table  - 2 x daily - 7 x weekly - 1 sets - 5 reps  ASSESSMENT:   CLINICAL IMPRESSION: Chavie is working on avoiding bilateral knee hyperextension with standing more than a few seconds.  We reviewed her home exercises and Otis was encouraged to get all exercises in to address her low back, radicular and bilateral knee impairments.  Her prognosis to continue to get stronger, improve standing and walking endurance and improve her function is good with the recommended plan of care.  Given the amount of wear and tear in her knees, Maya may be a TKA candidate.  She did note low back pain and lateral thigh pain has not been bothering her as much as her knees.   OBJECTIVE IMPAIRMENTS: Abnormal gait, decreased activity tolerance, decreased endurance, decreased knowledge of condition, difficulty walking, decreased ROM, decreased strength, decreased safety awareness, increased edema, impaired perceived functional ability, increased muscle spasms, impaired flexibility, improper body mechanics, postural dysfunction, obesity, and pain.    ACTIVITY LIMITATIONS: lifting, bending, standing, squatting, stairs, and locomotion level   PARTICIPATION LIMITATIONS: interpersonal relationship, community activity, and occupation   PERSONAL FACTORS: Severe Rt knee OA, HLD, HTN, pre-diabetes are also affecting patient's functional outcome.    REHAB POTENTIAL: Good   CLINICAL DECISION MAKING: Stable/uncomplicated   EVALUATION COMPLEXITY: Moderate     GOALS: Goals reviewed with patient? Yes   SHORT TERM GOALS: Target date: 04/26/2024   Yliana will be independent with her day 1 HEP Baseline: Started 03/29/2023 Goal status: On Going 04/27/2023   2.  Improve lumbar extension active range of motion to at least 5 degrees Baseline: 0 degrees Goal status: On Going 04/26/2024   3.  Improve bilateral hip flexion active range of motion to at least 90 degrees and external  rotation to at least 30 degrees Baseline: 80 degrees and 20 degrees respectively Goal status: Partially Met 04/27/2023     LONG TERM GOALS: Target date: 05/25/2023   Improve FOTO to 46 Baseline: 36 Goal status: On Going 04/27/2023   2.  Algia will report low back and bilateral knee pain pain consistently 0-3/10 on the Numeric Pain Rating Scale Baseline: 4-6/10 Goal status: On Going 04/27/2023   3.  Improve bilateral quadriceps and spine strength as assessed by FOTO scores and objective measures Baseline: FOTO 36, objective measures deferred secondary to time at evaluation Goal status: On Going 04/27/2023   4.  Bridgetta will report improved weight-bearing function at home at work as a result of improved back and knee strength and pain Baseline: Limited with weight-bearing function at home and work Goal status: On Going 04/27/2023   5. Sendi will be  independent with her long-term maintenance HEP at [place]. Baseline: Started 03/30/2023 Goal status: On Going 04/27/2023   PLAN:   PT FREQUENCY: 1-2x/week   PT DURATION: 4 weeks   PLANNED INTERVENTIONS: Therapeutic exercises, Therapeutic activity, Neuromuscular re-education, Balance training, Gait training, Patient/Family education, Self Care, Stair training, Dry Needling, Cryotherapy, Traction, and Manual therapy.   PLAN FOR NEXT SESSION: Progress back and quadriceps strength, avoid knee hyper-extension.  Cherlyn Cushing PT, MPT 04/27/23  9:22 AM

## 2023-05-03 ENCOUNTER — Encounter: Payer: Self-pay | Admitting: Rehabilitative and Restorative Service Providers"

## 2023-05-03 ENCOUNTER — Ambulatory Visit (INDEPENDENT_AMBULATORY_CARE_PROVIDER_SITE_OTHER): Payer: 59 | Admitting: Rehabilitative and Restorative Service Providers"

## 2023-05-03 DIAGNOSIS — R262 Difficulty in walking, not elsewhere classified: Secondary | ICD-10-CM | POA: Diagnosis not present

## 2023-05-03 DIAGNOSIS — M25561 Pain in right knee: Secondary | ICD-10-CM | POA: Diagnosis not present

## 2023-05-03 DIAGNOSIS — R293 Abnormal posture: Secondary | ICD-10-CM | POA: Diagnosis not present

## 2023-05-03 DIAGNOSIS — M5459 Other low back pain: Secondary | ICD-10-CM | POA: Diagnosis not present

## 2023-05-03 DIAGNOSIS — G8929 Other chronic pain: Secondary | ICD-10-CM | POA: Diagnosis not present

## 2023-05-03 DIAGNOSIS — M5416 Radiculopathy, lumbar region: Secondary | ICD-10-CM

## 2023-05-03 NOTE — Therapy (Signed)
OUTPATIENT PHYSICAL THERAPY TREATMENT NOTE   Patient Name: Anne [name] MRN: [id number] DOB:Jan 05, 1956, 67 y.o., female Today's Date: 05/03/2023   END OF SESSION:   PT End of Session - 05/03/23 0903     Visit Number 10    Date for PT Re-Evaluation 05/25/23    Authorization Type [employer] Save/Medicare secondary    Authorization - Visit Number 10    Progress Note Due on Visit 19    PT Start Time 0846    PT Stop Time 0928    PT Time Calculation (min) 42 min    Activity Tolerance Patient tolerated treatment well;No increased pain    Behavior During Therapy WFL for tasks assessed/performed             Past Medical History:  Diagnosis Date   Arthritis of knee    right knee   Hyperlipidemia    Hypertension    followed by pcp  and cardiology (dr h. Katrinka Blazing--- coronary CT 09-01-2020  calcium score zero and mild prox LAD calcification)   PMB (postmenopausal bleeding)    Pre-diabetes    followed by pcp   Prediabetes    Past Surgical History:  Procedure Laterality Date   CESAREAN SECTION  1988   COLONOSCOPY     DILATATION & CURETTAGE/HYSTEROSCOPY WITH MYOSURE N/A 10/22/2020   Procedure: DILATATION & CURETTAGE/HYSTEROSCOPY WITH MYOSURE;  Surgeon: Maxie Better, MD;  Location: [facility];  Service: Gynecology;  Laterality: N/A;   Patient Active Problem List   Diagnosis Date Noted   Primary osteoarthritis of right knee 03/20/2023   Chronic pain of right knee 03/20/2023   Prediabetes 09/09/2019   Vitamin D insufficiency 09/09/2019   Class 2 severe obesity due to excess calories with serious comorbidity and body mass index (BMI) of 36.0 to 36.9 in adult (HCC) 09/08/2019   HTN, goal below 130/80 09/08/2019   Chronic low back pain 05/29/2019     THERAPY DIAG:  Abnormal posture  Radiculopathy, lumbar region  Other low back pain  Difficulty in walking, not elsewhere classified  Chronic pain of right knee   PCP: Aliene Beams, MD   REFERRING  PROVIDER: Tarry Kos, MD  REFERRING DIAG:  Diagnosis  M25.561,G89.29 (ICD-10-CM) - Chronic pain of right knee  M54.41,G89.29 (ICD-10-CM) - Chronic right-sided low back pain with right-sided sciatica  M17.11 (ICD-10-CM) - Primary osteoarthritis of right knee      Rationale for Evaluation and Treatment: Rehabilitation   THERAPY DIAG:  Abnormal posture - Plan: PT plan of care cert/re-cert   Radiculopathy, lumbar region - Plan: PT plan of care cert/re-cert   Other low back pain - Plan: PT plan of care cert/re-cert   Difficulty in walking, not elsewhere classified - Plan: PT plan of care cert/re-cert   Chronic pain of right knee - Plan: PT plan of care cert/re-cert   ONSET DATE: Anne [name] notes a 6 month history of low back, right thigh and right knee pain.   SUBJECTIVE:  SUBJECTIVE STATEMENT: Anne [name] notes more complete HEP compliance since her last visit.  Her knees are very painful, particularly late in her workday.  She is still working on avoiding knee hyper-extension with standing.  Back pain is improving as is sciatica.   PERTINENT HISTORY:  Severe Rt knee OA, HLD, HTN, pre-diabetes   PAIN:  Yes: NPRS scale: 4-6/10 bilateral knees, low back and right sciatica to the lateral thigh 0-4/10 Pain location: Low back, right thigh and right > left knee Pain description: Ache, sore Aggravating factors: Work, movement, hyper-extension Relieving factors: Rest, heat, over the counter pain medication   PRECAUTIONS: Back   RED FLAGS: None      WEIGHT BEARING RESTRICTIONS: No   FALLS:  Has patient fallen in last 6 months? 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   NEXT MD VISIT: NA   OBJECTIVE:    DIAGNOSTIC FINDINGS:  Right knee: X-rays demonstrate severe osteoarthritis.  Bone-on-bone joint space  narrowing with valgus deformity   Low back: X-rays of the lumbar spine show diffuse degenerative changes including a  degenerative L4-5 spondylolisthesis grade 2   PATIENT SURVEYS:  04/27/2023: FOTO 42  Eval: FOTO 36 (Goal 46 in 14 visits)   SCREENING FOR RED FLAGS: Bowel or bladder incontinence: No Spinal tumors: No Cauda equina syndrome: No Compression fracture: No Abdominal aneurysm: No   COGNITION: Overall cognitive status: Within functional limits for tasks assessed                          SENSATION: Anne [name] notes daily right sciatica as distal as the right knee   MUSCLE LENGTH: 04/27/2023: Hamstrings: Right 50 deg; Left 50 deg  Eval: Hamstrings: Right 50 deg; Left 50 deg   POSTURE: rounded shoulders, forward head, and decreased lumbar lordosis     LUMBAR ROM:    AROM 03/30/2023 04/27/2023  Flexion     Extension 0 0  Right lateral flexion     Left lateral flexion     Right rotation     Left rotation      (Blank rows = not tested)   LOWER EXTREMITY ROM:      Passive  Left/Right 03/30/2023  Left/Right 04/27/2023  Hip flexion 80/80  90/90  Hip extension      Hip abduction      Hip adduction      Hip internal rotation 4/8    Hip external rotation 20/20    Knee flexion 112/116  114/117  Knee extension 0/0  0/0  Ankle dorsiflexion      Ankle plantarflexion      Ankle inversion      Ankle eversion       (Blank rows = not tested)   LOWER EXTREMITY STRENGTH:  Deferred at evaluation secondary to time   MMT Left/Right 03/30/2023  Left/Right 04/27/2023  Hip flexion      Hip extension      Hip abduction      Hip adduction      Hip internal rotation       Hip external rotation      Knee flexion  Knee extension   66.2/ 65.9  Ankle dorsiflexion      Ankle plantarflexion      Ankle inversion      Ankle eversion       (Blank rows = not tested)     GAIT: 04/18/2023:  Independent ambulation with noted antalgic gait with deviation of Rt leg stance with hyperextension/valgus noted on both knees but Rt more than Lt.     Eval: Distance walked: 50 feet Assistive device utilized: None Level of assistance: Complete Independence Comments: Mild flexed posture and valgus right knee noted   TODAY'S TREATMENT:                                                                   DATE:   05/03/2023 NuStep Level 6 for 8 minutes with heat on low back Seated straight leg raises 3 sets of 5 for 3 seconds with 1# Hip hike at counter top and in door frame 10 x 3 seconds each, work on avoiding knee hyper-extension and lateral lean Limited range sit to stand in parallel bars 5 x focus on hips back like sitting in a chair, 3 second hold and avoid knee hyperextension  Functional Activities: Double leg press with emphasis on full extension and avoiding knee hyper-extension 15 x 81# slow eccentrics Single leg press with emphasis on full extension and avoiding knee hyper-extension 15 x 56# slow eccentrics   04/27/2023 NuStep Level 6 for 8 minutes with heat on low back  Functional Activities: Double leg press with emphasis on full extension and avoiding knee hyper-extension 15 x 75# slow eccentrics Single leg press with emphasis on full extension and avoiding knee hyper-extension 15 x 50# slow eccentrics Reassessment, FOTO, importance of posture, continued HEP compliance and avoiding knee hyper-extension  HEP activities: -Seated straight leg raises 3 sets of 5 for 3 seconds with 0# -Hip hike at counter top 10 x 3 seconds each, work on avoiding knee hyper-extension and lateral lean -Limited range sit to stand in parallel bars 5 x focus on hips back like  sitting in a chair, 3 second hold and avoid knee hyperextension   04/26/2023 NuStep Level 6 for 7 minutes with heat on low back Seated straight leg raises 3 sets of 5 for 3 seconds with 0# Hip hike at counter top 10 x 3 seconds each, work on avoiding knee hyper-extension and lateral lean   Functional Activities: Double leg press with emphasis on full extension and avoiding knee hyper-extension 15 x 75# slow eccentrics Single leg press with emphasis on full extension and avoiding knee hyper-extension 15 x 50# slow eccentrics Limited range sit to stand in parallel bars 5 x focus on hips back like sitting in a chair, 3 second hold and avoid knee hyperextension  Log roll, the importance of spine posture, avoiding knee hyper-extension and HEP    PATIENT EDUCATION:  Education details: See above Person educated: Patient Education method: Explanation, Demonstration, Tactile cues, Verbal cues, and Handouts Education comprehension: verbalized understanding, returned demonstration, verbal cues required, tactile cues required, and needs further education   HOME EXERCISE PROGRAM: Access Code: [email or web address] URL: [email or web address] Date: 04/26/2023 Prepared by: Pauletta Browns  Exercises - Standing Lumbar Extension at Wall - Forearms  - 5 x daily - 7 x  weekly - 1 sets - 5 reps - 3 seconds hold - Standing Scapular Retraction  - 5 x daily - 7 x weekly - 1 sets - 5 reps - 5 second hold - Supine Quadricep Sets  - 5 x daily - 7 x weekly - 2 sets - 10 reps - 5 second hold - Small Range Straight Leg Raise  - 2 x daily - 7 x weekly - 3-5 sets - 5 reps - 3 seconds hold - Standing Hip Hiking  - 3-5 x daily - 7 x weekly - 1 sets - 10 reps - 3 seconds hold - Mini Squats at Table  - 2 x daily - 7 x weekly - 1 sets - 5 reps  ASSESSMENT:   CLINICAL IMPRESSION: Imagine continues to work on avoiding bilateral knee hyperextension with standing.  Her low back and radicular symptoms do well as long as  she avoids prolonged sitting and flexed postures.  Her bilateral knee impairments will benefit from quadriceps strengthening, although she is likely a TKA candidate given the severe OA present.  Her prognosis to continue to get stronger, improve standing and walking endurance and improve her function is good with the recommended plan of care.     OBJECTIVE IMPAIRMENTS: Abnormal gait, decreased activity tolerance, decreased endurance, decreased knowledge of condition, difficulty walking, decreased ROM, decreased strength, decreased safety awareness, increased edema, impaired perceived functional ability, increased muscle spasms, impaired flexibility, improper body mechanics, postural dysfunction, obesity, and pain.    ACTIVITY LIMITATIONS: lifting, bending, standing, squatting, stairs, and locomotion level   PARTICIPATION LIMITATIONS: interpersonal relationship, community activity, and occupation   PERSONAL FACTORS: Severe Rt knee OA, HLD, HTN, pre-diabetes are also affecting patient's functional outcome.    REHAB POTENTIAL: Good   CLINICAL DECISION MAKING: Stable/uncomplicated   EVALUATION COMPLEXITY: Moderate     GOALS: Goals reviewed with patient? Yes   SHORT TERM GOALS: Target date: 04/26/2024   Anne [name] will be independent with her day 1 HEP Baseline: Started 03/29/2023 Goal status: Met 05/03/2023   2.  Improve lumbar extension active range of motion to at least 5 degrees Baseline: 0 degrees Goal status: On Going 04/26/2024   3.  Improve bilateral hip flexion active range of motion to at least 90 degrees and external rotation to at least 30 degrees Baseline: 80 degrees and 20 degrees respectively Goal status: Partially Met 04/27/2023     LONG TERM GOALS: Target date: 05/25/2023   Improve FOTO to 46 Baseline: 36 Goal status: On Going 04/27/2023   2.  Anne [name] will report low back and bilateral knee pain pain consistently 0-3/10 on the Numeric Pain Rating Scale Baseline:  4-6/10 Goal status: On Going 05/03/2023   3.  Improve bilateral quadriceps and spine strength as assessed by FOTO scores and objective measures Baseline: FOTO 36, objective measures deferred secondary to time at evaluation Goal status: On Going 04/27/2023   4.  Anne [name] will report improved weight-bearing function at home at work as a result of improved back and knee strength and pain Baseline: Limited with weight-bearing function at home and work Goal status: On Going 05/03/2023   5. Anne [name] will be independent with her long-term maintenance HEP at [place]. Baseline: Started 03/30/2023 Goal status: On Going 05/03/2023   PLAN:   PT FREQUENCY: 1-2x/week   PT DURATION: 3-4 weeks   PLANNED INTERVENTIONS: Therapeutic exercises, Therapeutic activity, Neuromuscular re-education, Balance training, Gait training, Patient/Family education, Self Care, Stair training, Dry Needling, Cryotherapy, Traction, and Manual therapy.   PLAN  FOR NEXT SESSION: Progress back and quadriceps strength, avoid knee hyper-extension.  Review posture and body mechanics PRN.  Cherlyn Cushing PT, MPT 05/03/23  1:28 PM

## 2023-05-10 ENCOUNTER — Ambulatory Visit: Payer: 59 | Admitting: Rehabilitative and Restorative Service Providers"

## 2023-05-10 ENCOUNTER — Encounter: Payer: Self-pay | Admitting: Rehabilitative and Restorative Service Providers"

## 2023-05-10 DIAGNOSIS — G8929 Other chronic pain: Secondary | ICD-10-CM | POA: Diagnosis not present

## 2023-05-10 DIAGNOSIS — M5459 Other low back pain: Secondary | ICD-10-CM | POA: Diagnosis not present

## 2023-05-10 DIAGNOSIS — R262 Difficulty in walking, not elsewhere classified: Secondary | ICD-10-CM

## 2023-05-10 DIAGNOSIS — M5416 Radiculopathy, lumbar region: Secondary | ICD-10-CM | POA: Diagnosis not present

## 2023-05-10 DIAGNOSIS — R293 Abnormal posture: Secondary | ICD-10-CM | POA: Diagnosis not present

## 2023-05-10 DIAGNOSIS — M25561 Pain in right knee: Secondary | ICD-10-CM

## 2023-05-10 NOTE — Therapy (Signed)
OUTPATIENT PHYSICAL THERAPY TREATMENT NOTE   Patient Name: Anne [name] MRN: [id number] DOB:21-Oct-1955, 67 y.o., female Today's Date: 05/10/2023   END OF SESSION:   PT End of Session - 05/10/23 0809     Visit Number 11    Date for PT Re-Evaluation 05/25/23    Authorization Type [employer] Save/Medicare secondary    Authorization - Visit Number 11    Progress Note Due on Visit 19    PT Start Time 0807    PT Stop Time 0850    PT Time Calculation (min) 43 min    Activity Tolerance Patient tolerated treatment well;No increased pain    Behavior During Therapy WFL for tasks assessed/performed             Past Medical History:  Diagnosis Date   Arthritis of knee    right knee   Hyperlipidemia    Hypertension    followed by pcp  and cardiology (dr h. Katrinka Blazing--- coronary CT 09-01-2020  calcium score zero and mild prox LAD calcification)   PMB (postmenopausal bleeding)    Pre-diabetes    followed by pcp   Prediabetes    Past Surgical History:  Procedure Laterality Date   CESAREAN SECTION  1988   COLONOSCOPY     DILATATION & CURETTAGE/HYSTEROSCOPY WITH MYOSURE N/A 10/22/2020   Procedure: DILATATION & CURETTAGE/HYSTEROSCOPY WITH MYOSURE;  Surgeon: Maxie Better, MD;  Location: [facility];  Service: Gynecology;  Laterality: N/A;   Patient Active Problem List   Diagnosis Date Noted   Primary osteoarthritis of right knee 03/20/2023   Chronic pain of right knee 03/20/2023   Prediabetes 09/09/2019   Vitamin D insufficiency 09/09/2019   Class 2 severe obesity due to excess calories with serious comorbidity and body mass index (BMI) of 36.0 to 36.9 in adult (HCC) 09/08/2019   HTN, goal below 130/80 09/08/2019   Chronic low back pain 05/29/2019     THERAPY DIAG:  Abnormal posture  Radiculopathy, lumbar region  Other low back pain  Difficulty in walking, not elsewhere classified  Chronic pain of right knee   PCP: Aliene Beams, MD   REFERRING  PROVIDER: Tarry Kos, MD  REFERRING DIAG:  Diagnosis  M25.561,G89.29 (ICD-10-CM) - Chronic pain of right knee  M54.41,G89.29 (ICD-10-CM) - Chronic right-sided low back pain with right-sided sciatica  M17.11 (ICD-10-CM) - Primary osteoarthritis of right knee      Rationale for Evaluation and Treatment: Rehabilitation   THERAPY DIAG:  Abnormal posture - Plan: PT plan of care cert/re-cert   Radiculopathy, lumbar region - Plan: PT plan of care cert/re-cert   Other low back pain - Plan: PT plan of care cert/re-cert   Difficulty in walking, not elsewhere classified - Plan: PT plan of care cert/re-cert   Chronic pain of right knee - Plan: PT plan of care cert/re-cert   ONSET DATE: Anne [name] notes a 6 month history of low back, right thigh and right knee pain.     SUBJECTIVE:  SUBJECTIVE STATEMENT: Anne [name] notes minimal back pain over the past week.  She has not taken any additional pain medication this week.  Knees are better although the left is more irritating.  She reports better HEP compliance since her last visit.  Her knees are most limiting, particularly late in her workday or on her 2nd or 3rd day in a row.  She is getting better at avoiding knee hyper-extension with standing.  Back pain and sciatica are much better.   PERTINENT HISTORY:  Severe Rt knee OA, HLD, HTN, pre-diabetes   PAIN:  Yes: NPRS scale: 3-5/10 bilateral knees, low back and right sciatica to the lateral thigh 0/10 Pain location: Low back, right thigh and right > left knee Pain description: Ache, sore Aggravating factors: Work, movement, hyper-extension Relieving factors: Rest, heat, over the counter pain medication   PRECAUTIONS: Back   RED FLAGS: None      WEIGHT BEARING RESTRICTIONS: No   FALLS:  Has patient fallen in  last 6 months? 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   NEXT MD VISIT: NA   OBJECTIVE:    DIAGNOSTIC FINDINGS:  Right knee: X-rays demonstrate severe osteoarthritis.  Bone-on-bone joint space  narrowing with valgus deformity   Low back: X-rays of the lumbar spine show diffuse degenerative changes including a  degenerative L4-5 spondylolisthesis grade 2   PATIENT SURVEYS:  04/27/2023: FOTO 42  Eval: FOTO 36 (Goal 46 in 14 visits)   SCREENING FOR RED FLAGS: Bowel or bladder incontinence: No Spinal tumors: No Cauda equina syndrome: No Compression fracture: No Abdominal aneurysm: No   COGNITION: Overall cognitive status: Within functional limits for tasks assessed                          SENSATION: Anne [name] notes daily right sciatica as distal as the right knee   MUSCLE LENGTH: 04/27/2023: Hamstrings: Right 50 deg; Left 50 deg  Eval: Hamstrings: Right 50 deg; Left 50 deg   POSTURE: rounded shoulders, forward head, and decreased lumbar lordosis     LUMBAR ROM:    AROM 03/30/2023 04/27/2023  Flexion     Extension 0 0  Right lateral flexion     Left lateral flexion     Right rotation     Left rotation      (Blank rows = not tested)   LOWER EXTREMITY ROM:      Passive  Left/Right 03/30/2023  Left/Right 04/27/2023  Hip flexion 80/80  90/90  Hip extension      Hip abduction      Hip adduction      Hip internal rotation 4/8    Hip external rotation 20/20    Knee flexion 112/116  114/117  Knee extension 0/0  0/0  Ankle dorsiflexion      Ankle plantarflexion      Ankle inversion      Ankle eversion       (Blank rows = not tested)   LOWER EXTREMITY STRENGTH:   Deferred at evaluation secondary to time   MMT Left/Right 03/30/2023  Left/Right 04/27/2023  Hip flexion      Hip extension  Hip abduction      Hip adduction      Hip internal rotation      Hip external rotation      Knee flexion      Knee extension   66.2/ 65.9  Ankle dorsiflexion      Ankle plantarflexion      Ankle inversion      Ankle eversion       (Blank rows = not tested)     GAIT: 04/18/2023:  Independent ambulation with noted antalgic gait with deviation of Rt leg stance with hyperextension/valgus noted on both knees but Rt more than Lt.     Eval: Distance walked: 50 feet Assistive device utilized: None Level of assistance: Complete Independence Comments: Mild flexed posture and valgus right knee noted   TODAY'S TREATMENT:                                                                   DATE:   05/10/2023 NuStep Level 7 for 8 minutes with heat on low back Seated straight leg raises 3 sets of 5 for 3 seconds with 1# Hip hike at counter top 2 sets of 10 x 3 seconds, work on avoiding knee hyper-extension and lateral lean Limited range sit to stand in parallel bars 5 x focus on hips back like sitting in a chair, 3 second hold and avoid knee hyperextension  Functional Activities: Double leg press with emphasis on full extension and avoiding knee hyper-extension 15 x 87# slow eccentrics Single leg press with emphasis on full extension and avoiding knee hyper-extension 15 x 62# slow eccentrics   05/03/2023 NuStep Level 6 for 8 minutes with heat on low back Seated straight leg raises 3 sets of 5 for 3 seconds with 1# Hip hike at counter top and in door frame 10 x 3 seconds each, work on avoiding knee hyper-extension and lateral lean Limited range sit to stand in parallel bars 5 x focus on hips back like sitting in a chair, 3 second hold and avoid knee hyperextension  Functional Activities: Double leg press with emphasis on full extension and avoiding knee  hyper-extension 15 x 81# slow eccentrics Single leg press with emphasis on full extension and avoiding knee hyper-extension 15 x 56# slow eccentrics   04/27/2023 NuStep Level 6 for 8 minutes with heat on low back  Functional Activities: Double leg press with emphasis on full extension and avoiding knee hyper-extension 15 x 75# slow eccentrics Single leg press with emphasis on full extension and avoiding knee hyper-extension 15 x 50# slow eccentrics Reassessment, FOTO, importance of posture, continued HEP compliance and avoiding knee hyper-extension  HEP activities: -Seated straight leg raises 3 sets of 5 for 3 seconds with 0# -Hip hike at counter top 10 x 3 seconds each, work on avoiding knee hyper-extension and lateral lean -Limited range sit to stand in parallel bars 5 x focus on hips back like sitting in a chair, 3 second hold and avoid knee hyperextension   PATIENT EDUCATION:  Education details: See above Person educated: Patient Education method: Explanation, Demonstration, Tactile cues, Verbal cues, and Handouts Education comprehension: verbalized understanding, returned demonstration, verbal cues required, tactile cues required, and needs further education   HOME EXERCISE PROGRAM: Access Code: [email or web address] URL: [email or web address]  Date: 04/26/2023 Prepared by: Pauletta Browns  Exercises - Standing Lumbar Extension at Wall - Forearms  - 5 x daily - 7 x weekly - 1 sets - 5 reps - 3 seconds hold - Standing Scapular Retraction  - 5 x daily - 7 x weekly - 1 sets - 5 reps - 5 second hold - Supine Quadricep Sets  - 5 x daily - 7 x weekly - 2 sets - 10 reps - 5 second hold - Small Range Straight Leg Raise  - 2 x daily - 7 x weekly - 3-5 sets - 5 reps - 3 seconds hold - Standing Hip Hiking  - 3-5 x daily - 7 x weekly - 1 sets - 10 reps - 3 seconds hold - Mini Squats at Table  - 2 x daily - 7 x weekly - 1 sets - 5 reps  ASSESSMENT:   CLINICAL IMPRESSION: Natally notes  minimal back pain over the last week.  Her knees are better in that after 1-2 days in a row of her 12 hour shifts at work, her pain does not require medication.  By day 3 in a row, pain is more significant.  We are addressing this with appropriate quadriceps strengthening and Rikita and I discussed how her standing and walking endurance will be better with better quadriceps strength.  Given the significant amount of wear and tear in her knees, there will certainly be a limit as to how much she can stand and walk before she gets increasing knee pain.  I encouraged her to ask Dr. Roda Shutters whether or not she is a TKA candidate when she follows up with him next week.  OBJECTIVE IMPAIRMENTS: Abnormal gait, decreased activity tolerance, decreased endurance, decreased knowledge of condition, difficulty walking, decreased ROM, decreased strength, decreased safety awareness, increased edema, impaired perceived functional ability, increased muscle spasms, impaired flexibility, improper body mechanics, postural dysfunction, obesity, and pain.    ACTIVITY LIMITATIONS: lifting, bending, standing, squatting, stairs, and locomotion level   PARTICIPATION LIMITATIONS: interpersonal relationship, community activity, and occupation   PERSONAL FACTORS: Severe Rt knee OA, HLD, HTN, pre-diabetes are also affecting patient's functional outcome.    REHAB POTENTIAL: Good   CLINICAL DECISION MAKING: Stable/uncomplicated   EVALUATION COMPLEXITY: Moderate     GOALS: Goals reviewed with patient? Yes   SHORT TERM GOALS: Target date: 04/26/2024   Ivoree will be independent with her day 1 HEP Baseline: Started 03/29/2023 Goal status: Met 05/03/2023   2.  Improve lumbar extension active range of motion to at least 5 degrees Baseline: 0 degrees Goal status: On Going 04/26/2024   3.  Improve bilateral hip flexion active range of motion to at least 90 degrees and external rotation to at least 30 degrees Baseline: 80 degrees and  20 degrees respectively Goal status: Partially Met 04/27/2023     LONG TERM GOALS: Target date: 05/25/2023   Improve FOTO to 46 Baseline: 36 Goal status: On Going 04/27/2023   2.  Jassica will report low back and bilateral knee pain pain consistently 0-3/10 on the Numeric Pain Rating Scale Baseline: 4-6/10 Goal status: On Going 05/10/2023   3.  Improve bilateral quadriceps and spine strength as assessed by FOTO scores and objective measures Baseline: FOTO 36, objective measures deferred secondary to time at evaluation Goal status: On Going 04/27/2023   4.  Kestrel will report improved weight-bearing function at home at work as a result of improved back and knee strength and pain Baseline: Limited with weight-bearing function at  home and work Goal status: On Going 05/10/2023   5. Suan will be independent with her long-term maintenance HEP at [place]. Baseline: Started 03/30/2023 Goal status: On Going 05/10/2023   PLAN:   PT FREQUENCY: 1-2x/week   PT DURATION: 3-4 weeks   PLANNED INTERVENTIONS: Therapeutic exercises, Therapeutic activity, Neuromuscular re-education, Balance training, Gait training, Patient/Family education, Self Care, Stair training, Dry Needling, Cryotherapy, Traction, and Manual therapy.   PLAN FOR NEXT SESSION: Progress note and FOTO.  Progress back and quadriceps strength, avoid knee hyper-extension.  Review posture and body mechanics PRN.  Cherlyn Cushing PT, MPT 05/10/23  12:04 PM

## 2023-05-11 ENCOUNTER — Encounter: Payer: Self-pay | Admitting: Rehabilitative and Restorative Service Providers"

## 2023-05-11 ENCOUNTER — Ambulatory Visit (INDEPENDENT_AMBULATORY_CARE_PROVIDER_SITE_OTHER): Payer: 59 | Admitting: Rehabilitative and Restorative Service Providers"

## 2023-05-11 DIAGNOSIS — M25561 Pain in right knee: Secondary | ICD-10-CM | POA: Diagnosis not present

## 2023-05-11 DIAGNOSIS — G8929 Other chronic pain: Secondary | ICD-10-CM | POA: Diagnosis not present

## 2023-05-11 DIAGNOSIS — R262 Difficulty in walking, not elsewhere classified: Secondary | ICD-10-CM

## 2023-05-11 DIAGNOSIS — R293 Abnormal posture: Secondary | ICD-10-CM | POA: Diagnosis not present

## 2023-05-11 DIAGNOSIS — M5459 Other low back pain: Secondary | ICD-10-CM | POA: Diagnosis not present

## 2023-05-11 DIAGNOSIS — M5416 Radiculopathy, lumbar region: Secondary | ICD-10-CM

## 2023-05-11 NOTE — Therapy (Signed)
OUTPATIENT PHYSICAL THERAPY TREATMENT/PROGRESS NOTE  Progress Note Reporting Period 03/30/2023 to 05/11/2023  See note below for Objective Data and Assessment of Progress/Goals.     Patient Name: Anne [name] MRN: [id number] DOB:1955/08/08, 67 y.o., female Today's Date: 05/11/2023   END OF SESSION:   PT End of Session - 05/11/23 0807     Visit Number 12    Date for PT Re-Evaluation 05/25/23    Authorization Type [employer] Save/Medicare secondary    Authorization - Visit Number 12    Progress Note Due on Visit 19    PT Start Time 0804    PT Stop Time 0846    PT Time Calculation (min) 42 min    Activity Tolerance Patient tolerated treatment well;No increased pain    Behavior During Therapy WFL for tasks assessed/performed            Past Medical History:  Diagnosis Date   Arthritis of knee    right knee   Hyperlipidemia    Hypertension    followed by pcp  and cardiology (dr Anne [name]--- coronary CT 09-01-2020  calcium score zero and mild prox LAD calcification)   PMB (postmenopausal bleeding)    Pre-diabetes    followed by pcp   Prediabetes    Past Surgical History:  Procedure Laterality Date   CESAREAN SECTION  1988   COLONOSCOPY     DILATATION & CURETTAGE/HYSTEROSCOPY WITH MYOSURE N/A 10/22/2020   Procedure: DILATATION & CURETTAGE/HYSTEROSCOPY WITH MYOSURE;  Surgeon: Anne Better, MD;  Location: [facility];  Service: Gynecology;  Laterality: N/A;   Patient Active Problem List   Diagnosis Date Noted   Primary osteoarthritis of right knee 03/20/2023   Chronic pain of right knee 03/20/2023   Prediabetes 09/09/2019   Vitamin D insufficiency 09/09/2019   Class 2 severe obesity due to excess calories with serious comorbidity and body mass index (BMI) of 36.0 to 36.9 in adult (HCC) 09/08/2019   HTN, goal below 130/80 09/08/2019   Chronic low back pain 05/29/2019     THERAPY DIAG:  Abnormal posture - Plan: PT plan of care  cert/re-cert  Radiculopathy, lumbar region - Plan: PT plan of care cert/re-cert  Other low back pain - Plan: PT plan of care cert/re-cert  Difficulty in walking, not elsewhere classified - Plan: PT plan of care cert/re-cert  Chronic pain of right knee - Plan: PT plan of care cert/re-cert   PCP: Anne Beams, MD   REFERRING PROVIDER: Tarry Kos, MD  REFERRING DIAG:  Diagnosis  M25.561,G89.29 (ICD-10-CM) - Chronic pain of right knee  M54.41,G89.29 (ICD-10-CM) - Chronic right-sided low back pain with right-sided sciatica  M17.11 (ICD-10-CM) - Primary osteoarthritis of right knee      Rationale for Evaluation and Treatment: Rehabilitation   THERAPY DIAG:  Abnormal posture - Plan: PT plan of care cert/re-cert   Radiculopathy, lumbar region - Plan: PT plan of care cert/re-cert   Other low back pain - Plan: PT plan of care cert/re-cert   Difficulty in walking, not elsewhere classified - Plan: PT plan of care cert/re-cert   Chronic pain of right knee - Plan: PT plan of care cert/re-cert   ONSET DATE: Anne [name] notes a 6 month history of low back, right thigh and right knee pain.     SUBJECTIVE:  SUBJECTIVE STATEMENT: Anne [name] notes minimal back pain over the past week.  She has not taken any additional pain medication this week.  Knees are [name] although the left is more irritating.  HEP has improved.  Her knees are most limiting, particularly late in her workday or on her 2nd or 3rd day in a row.  She is getting [name] at avoiding knee hyper-extension with standing.  Back pain and sciatica are much [name].   PERTINENT HISTORY:  Severe Rt knee OA, HLD, HTN, pre-diabetes   PAIN:  Yes: NPRS scale: 3-5/10 bilateral knees, low back and right sciatica to the lateral thigh 0/10 Pain location: Low back,  right thigh and right > left knee Pain description: Ache, sore Aggravating factors: Work, movement, hyper-extension Relieving factors: Rest, heat, over the counter pain medication   PRECAUTIONS: Back   RED FLAGS: None      WEIGHT BEARING RESTRICTIONS: No   FALLS:  Has patient fallen in last 6 months? 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   NEXT MD VISIT: NA   OBJECTIVE:    DIAGNOSTIC FINDINGS:  Right knee: X-rays demonstrate severe osteoarthritis.  Bone-on-bone joint space  narrowing with valgus deformity   Low back: X-rays of the lumbar spine show diffuse degenerative changes including a  degenerative L4-5 spondylolisthesis grade 2   PATIENT SURVEYS:  05/11/2023: FOTO 45  04/27/2023: FOTO 42  Eval: FOTO 36 (Goal 46 in 14 visits)   SCREENING FOR RED FLAGS: Bowel or bladder incontinence: No Spinal tumors: No Cauda equina syndrome: No Compression fracture: No Abdominal aneurysm: No   COGNITION: Overall cognitive status: Within functional limits for tasks assessed                          SENSATION: Anne [name] notes daily right sciatica as distal as the right knee   MUSCLE LENGTH: 05/11/2023: Hamstrings: Right 50 deg; Left 50 deg  04/27/2023: Hamstrings: Right 50 deg; Left 50 deg  Eval: Hamstrings: Right 50 deg; Left 50 deg   POSTURE: rounded shoulders, forward head, and decreased lumbar lordosis     LUMBAR ROM:    AROM 03/30/2023 04/27/2023 05/11/2023  Flexion      Extension 0 0 15  Right lateral flexion      Left lateral flexion      Right rotation      Left rotation       (Blank rows = not tested)   LOWER EXTREMITY ROM:      Passive  Left/Right  03/30/2023  Left/Right 04/27/2023 Left/Right 05/11/2023  Hip flexion 80/80  90/90 95/95  Hip extension       Hip abduction       Hip adduction       Hip internal rotation 4/8   4/9  Hip external rotation 20/20   29/28  Knee flexion 112/116  114/117 115/117  Knee extension 0/0  0/0 0/0  Ankle dorsiflexion       Ankle plantarflexion       Ankle inversion       Ankle eversion        (Blank rows = not tested)   LOWER EXTREMITY  STRENGTH:  Deferred at evaluation secondary to time   MMT Left/Right 03/30/2023  Left/Right 04/27/2023 Left/Right 05/11/2023  Hip flexion       Hip extension       Hip abduction       Hip adduction       Hip internal rotation       Hip external rotation       Knee flexion       Knee extension   66.2/ 65.9 39.0/73.2  Ankle dorsiflexion       Ankle plantarflexion       Ankle inversion       Ankle eversion        (Blank rows = not tested)     GAIT: 04/18/2023:  Independent ambulation with noted antalgic gait with deviation of Rt leg stance with hyperextension/valgus noted on both knees but Rt more than Lt.     Eval: Distance walked: 50 feet Assistive device utilized: None Level of assistance: Complete Independence Comments: Mild flexed posture and valgus right knee noted   TODAY'S TREATMENT:                                                                   DATE:   05/11/2023 Seated straight leg raises 3 sets of 5 for 3 seconds with 1.5# Hip hike at counter top 2 sets of 10 x 3 seconds, work on avoiding knee hyper-extension and lateral lean Bridging 10 x 5 seconds  Functional Activities: Double leg press with emphasis on full extension and avoiding knee hyper-extension 15 x 87# slow eccentrics Single leg press with emphasis on full extension and avoiding knee hyper-extension 15 x 62# slow eccentrics Progress note review and HEP update   05/10/2023 NuStep Level 7 for 8 minutes with heat on low back Seated straight leg raises 3 sets of 5 for 3 seconds  with 1# Hip hike at counter top 2 sets of 10 x 3 seconds, work on avoiding knee hyper-extension and lateral lean Limited range sit to stand in parallel bars 5 x focus on hips back like sitting in a chair, 3 second hold and avoid knee hyperextension  Functional Activities: Double leg press with emphasis on full extension and avoiding knee hyper-extension 15 x 87# slow eccentrics Single leg press with emphasis on full extension and avoiding knee hyper-extension 15 x 62# slow eccentrics   05/03/2023 NuStep Level 6 for 8 minutes with heat on low back Seated straight leg raises 3 sets of 5 for 3 seconds with 1# Hip hike at counter top and in door frame 10 x 3 seconds each, work on avoiding knee hyper-extension and lateral lean Limited range sit to stand in parallel bars 5 x focus on hips back like sitting in a chair, 3 second hold and avoid knee hyperextension  Functional Activities: Double leg press with emphasis on full extension and avoiding knee hyper-extension 15 x 81# slow eccentrics Single leg press with emphasis on full extension and avoiding knee hyper-extension 15 x 56# slow eccentrics   PATIENT EDUCATION:  Education details: See above Person educated: Patient Education method: Explanation, Demonstration, Tactile cues, Verbal cues, and Handouts Education comprehension: verbalized understanding, returned demonstration, verbal cues required, tactile cues required, and needs further education   HOME EXERCISE  PROGRAM: Access Code: [email or web address] URL: [email or web address] Date: 05/11/2023 Prepared by: Pauletta Browns  Exercises - Standing Lumbar Extension at Wall - Forearms  - 5 x daily - 7 x weekly - 1 sets - 5 reps - 3 seconds hold - Standing Scapular Retraction  - 5 x daily - 7 x weekly - 1 sets - 5 reps - 5 second hold - Standing Hip Hiking  - 3-5 x daily - 7 x weekly - 1 sets - 10 reps - 3 seconds hold - Yoga Bridge  - 2 x daily - 7 x weekly - 1 sets - 10 reps - 5  seconds hold - Modified [name] Stretch  - 2 x daily - 1 x weekly - 4-5 reps - 20 seconds hold - Supine Hamstring Stretch  - 2 x daily - 1 x weekly - 1 sets - 4-5 reps - 20 seconds hold - Supine Figure 4 Piriformis Stretch  - 2 x daily - 1 x weekly - 1 sets - 4-5 reps - 20 seconds hold - Small Range Straight Leg Raise  - 2 x daily - 7 x weekly - 3-5 sets - 5 reps - 3 seconds hold - Mini Squats at Table  - 1 x daily - 7 x weekly - 1 sets - 5-10 reps  ASSESSMENT:   CLINICAL IMPRESSION: Twilia is making excellent progress towards long-term goals.  Her low back pain has made significant progress objectively, subjectively and based on functional scores.  Her quadricep strength and knee function have improved, although not at the same pace as her back.  Khalina has significant osteoarthritic changes of her knees and will be following up with Dr. Roda Shutters to discuss his recommendations about TKA.  If Rahmah is a candidate for TKA over the next month or so, she will benefit from continuing her home exercises independently.  If Dalya is not a TKA candidate or will not be having surgery for several months, she will benefit from the full recommended course of supervised physical therapy.  OBJECTIVE IMPAIRMENTS: Abnormal gait, decreased activity tolerance, decreased endurance, decreased knowledge of condition, difficulty walking, decreased ROM, decreased strength, decreased safety awareness, increased edema, impaired perceived functional ability, increased muscle spasms, impaired flexibility, improper body mechanics, postural dysfunction, obesity, and pain.    ACTIVITY LIMITATIONS: lifting, bending, standing, squatting, stairs, and locomotion level   PARTICIPATION LIMITATIONS: interpersonal relationship, community activity, and occupation   PERSONAL FACTORS: Severe Rt knee OA, HLD, HTN, pre-diabetes are also affecting patient's functional outcome.    REHAB POTENTIAL: Good   CLINICAL DECISION MAKING:  Stable/uncomplicated   EVALUATION COMPLEXITY: Moderate     GOALS: Goals reviewed with patient? Yes   SHORT TERM GOALS: Target date: 04/26/2024   Terrance will be independent with her day 1 HEP Baseline: Started 03/29/2023 Goal status: Met 05/03/2023   2.  Improve lumbar extension active range of motion to at least 5 degrees Baseline: 0 degrees Goal status: Met 05/11/2023   3.  Improve bilateral hip flexion active range of motion to at least 90 degrees and external rotation to at least 30 degrees Baseline: 80 degrees and 20 degrees respectively Goal status: Partially Met 05/11/2023     LONG TERM GOALS: Target date: 06/08/2023   Improve FOTO to 46 Baseline: 36 Goal status: On Going 05/11/2023   2.  Nyna will report low back and bilateral knee pain pain consistently 0-3/10 on the Numeric Pain Rating Scale Baseline: 4-6/10 Goal status: Partially met 05/11/2023   3.  Improve bilateral  quadriceps and spine strength as assessed by FOTO scores and objective measures Baseline: FOTO 36, objective measures deferred secondary to time at evaluation Goal status: Partially met 05/11/2023   4.  Vanity will report improved weight-bearing function at home at work as a result of improved back and knee strength and pain Baseline: Limited with weight-bearing function at home and work Goal status: Partially met 05/11/2023   5. Jeliyah will be independent with her long-term maintenance HEP at [place]. Baseline: Started 03/30/2023 Goal status: On Going 05/11/2023   PLAN:   PT FREQUENCY: 1-2x/week   PT DURATION: 3-4 weeks   PLANNED INTERVENTIONS: Therapeutic exercises, Therapeutic activity, Neuromuscular re-education, Balance training, Gait training, Patient/Family education, Self Care, Stair training, Dry Needling, Cryotherapy, Traction, and Manual therapy.   PLAN FOR NEXT SESSION: Progress back and quadriceps strength, avoid knee hyper-extension.  Review posture and body mechanics PRN.  How was her  appointment with Dr. Roda Shutters?  TKA or no?  Cherlyn Cushing PT, MPT 05/11/23  3:47 PM

## 2023-05-15 ENCOUNTER — Encounter: Payer: Self-pay | Admitting: Orthopaedic Surgery

## 2023-05-15 ENCOUNTER — Ambulatory Visit (INDEPENDENT_AMBULATORY_CARE_PROVIDER_SITE_OTHER): Payer: 59 | Admitting: Orthopaedic Surgery

## 2023-05-15 VITALS — Ht 62.0 in | Wt 227.2 lb

## 2023-05-15 DIAGNOSIS — M1712 Unilateral primary osteoarthritis, left knee: Secondary | ICD-10-CM | POA: Diagnosis not present

## 2023-05-15 DIAGNOSIS — Z6841 Body Mass Index (BMI) 40.0 and over, adult: Secondary | ICD-10-CM | POA: Diagnosis not present

## 2023-05-15 DIAGNOSIS — M1711 Unilateral primary osteoarthritis, right knee: Secondary | ICD-10-CM

## 2023-05-15 NOTE — Progress Notes (Addendum)
Office Visit Note   Patient: Anne [name]           Date of Birth: 12-03-1955           MRN: [id number] Visit Date: 05/15/2023              Requested by: Aliene Beams, MD [phone] [address] [place],  [place] [address] PCP: Aliene Beams, MD   Assessment & Plan: Visit Diagnoses:  1. Primary osteoarthritis of left knee   2. Primary osteoarthritis of right knee   3. Body mass index 40.0-44.9, adult [facility])     Plan: Anne [name] is a 67 year old female with end-stage bilateral knee DJD with windswept deformities.  Treatment options were again discussed and their associated risk benefits and prognosis.  She will think about her options and let us know how she wants to proceed.  In the meantime she will continue to manage symptomatically.  We will obtain a new height and weight today.  Total face to face encounter time was greater than 25 minutes and over half of this time was spent in counseling and/or coordination of care.  Follow-Up Instructions: No follow-ups on file.   Orders:  No orders of the defined types were placed in this encounter.  No orders of the defined types were placed in this encounter.     Procedures: No procedures performed   Clinical Data: No additional findings.   Subjective: Chief Complaint  Patient presents with   Right Knee - Pain    HPI Anne [name] is a 67 year old female who comes in for follow-up for continued right knee pain.  She felt some improvement with physical therapy home exercise program.  She continues to have pain during ADLs.  Review of Systems  Constitutional: Negative.   HENT: Negative.    Eyes: Negative.   Respiratory: Negative.    Cardiovascular: Negative.   Endocrine: Negative.   Musculoskeletal: Negative.   Neurological: Negative.   Hematological: Negative.   Psychiatric/Behavioral: Negative.    All other systems reviewed and are negative.    Objective: Vital Signs: Ht 5\' 2"  (1.575 m)   Wt 227 lb 3.2 oz  (103.1 kg)   BMI 41.56 kg/m   Physical Exam Vitals and nursing note reviewed.  Constitutional:      Appearance: She is well-developed.  HENT:     Head: Normocephalic and atraumatic.  Pulmonary:     Effort: Pulmonary effort is normal.  Abdominal:     Palpations: Abdomen is soft.  Musculoskeletal:     Cervical back: Neck supple.  Skin:    General: Skin is warm.     Capillary Refill: Capillary refill takes less than 2 seconds.  Neurological:     Mental Status: She is alert and oriented to person, place, and time.  Psychiatric:        Behavior: Behavior normal.        Thought Content: Thought content normal.        Judgment: Judgment normal.     Ortho Exam Exam of bilateral knees is unchanged from prior visit. Specialty Comments:  No specialty comments available.  Imaging: No results found.   PMFS History: Patient Active Problem List   Diagnosis Date Noted   Primary osteoarthritis of left knee 05/15/2023   Body mass index 40.0-44.9, adult (HCC) 05/15/2023   Primary osteoarthritis of right knee 03/20/2023   Chronic pain of right knee 03/20/2023   Prediabetes 09/09/2019   Vitamin D insufficiency 09/09/2019   Class 2  severe obesity due to excess calories with serious comorbidity and body mass index (BMI) of 36.0 to 36.9 in adult [facility]) 09/08/2019   HTN, goal below 130/80 09/08/2019   Chronic low back pain 05/29/2019   Past Medical History:  Diagnosis Date   Arthritis of knee    right knee   Hyperlipidemia    Hypertension    followed by pcp  and cardiology (dr h. Katrinka Blazing--- coronary CT 09-01-2020  calcium score zero and mild prox LAD calcification)   PMB (postmenopausal bleeding)    Pre-diabetes    followed by pcp   Prediabetes     Family History  Problem Relation Age of Onset   High Cholesterol Mother    Heart disease Mother    Obesity Mother    Heart disease Father    Diabetes Sister    Diverticulitis Sister    Alcohol abuse Brother    Liver disease  Brother    COPD Brother     Past Surgical History:  Procedure Laterality Date   CESAREAN SECTION  1988   COLONOSCOPY     DILATATION & CURETTAGE/HYSTEROSCOPY WITH MYOSURE N/A 10/22/2020   Procedure: DILATATION & CURETTAGE/HYSTEROSCOPY WITH MYOSURE;  Surgeon: Maxie Better, MD;  Location: [facility];  Service: Gynecology;  Laterality: N/A;   Social History   Occupational History   Occupation: Nursing  Tobacco Use   Smoking status: Never   Smokeless tobacco: Never  Vaping Use   Vaping status: Never Used  Substance and Sexual Activity   Alcohol use: Not Currently   Drug use: Never   Sexual activity: Not Currently    Birth control/protection: Post-menopausal

## 2023-05-17 ENCOUNTER — Encounter: Payer: Self-pay | Admitting: Rehabilitative and Restorative Service Providers"

## 2023-05-17 ENCOUNTER — Ambulatory Visit (INDEPENDENT_AMBULATORY_CARE_PROVIDER_SITE_OTHER): Payer: 59 | Admitting: Rehabilitative and Restorative Service Providers"

## 2023-05-17 DIAGNOSIS — R262 Difficulty in walking, not elsewhere classified: Secondary | ICD-10-CM | POA: Diagnosis not present

## 2023-05-17 DIAGNOSIS — M5459 Other low back pain: Secondary | ICD-10-CM | POA: Diagnosis not present

## 2023-05-17 DIAGNOSIS — G8929 Other chronic pain: Secondary | ICD-10-CM

## 2023-05-17 DIAGNOSIS — M5416 Radiculopathy, lumbar region: Secondary | ICD-10-CM | POA: Diagnosis not present

## 2023-05-17 DIAGNOSIS — R293 Abnormal posture: Secondary | ICD-10-CM | POA: Diagnosis not present

## 2023-05-17 DIAGNOSIS — M25561 Pain in right knee: Secondary | ICD-10-CM

## 2023-05-17 NOTE — Therapy (Signed)
OUTPATIENT PHYSICAL THERAPY TREATMENT NOTE    Patient Name: Anne [name] MRN: [id number] DOB:[birth year], 67 y.o., female Today's Date: 05/17/2023   END OF SESSION:   PT End of Session - 05/17/23 0824     Visit Number 13    Date for PT Re-Evaluation 05/25/23    Authorization Type AET[employer] Save/Medicare secondary    Authorization - Visit Number 13    Progress Note Due on Visit 19    PT Start Time 0812    PT Stop Time 0847    PT Time Calculation (min) 35 min    Activity Tolerance Patient tolerated treatment well;No increased pain    Behavior During Therapy WFL for tasks assessed/performed             Past Medical History:  Diagnosis Date   Arthritis of knee    right knee   Hyperlipidemia    Hypertension    followed by pcp  and cardiology (dr h. Katrinka Blazing--- coronary CT 09-01-2020  calcium score zero and mild prox LAD calcification)   PMB (postmenopausal bleeding)    Pre-diabetes    followed by pcp   Prediabetes    Past Surgical History:  Procedure Laterality Date   CESAREAN SECTION  1988   COLONOSCOPY     DILATATION & CURETTAGE/HYSTEROSCOPY WITH MYOSURE N/A 10/22/2020   Procedure: DILATATION & CURETTAGE/HYSTEROSCOPY WITH MYOSURE;  Surgeon: Maxie Better, MD;  Location: [facility];  Service: Gynecology;  Laterality: N/A;   Patient Active Problem List   Diagnosis Date Noted   Primary osteoarthritis of left knee 05/15/2023   Body mass index 40.0-44.9, adult (HCC) 05/15/2023   Primary osteoarthritis of right knee 03/20/2023   Chronic pain of right knee 03/20/2023   Prediabetes 09/09/2019   Vitamin D insufficiency 09/09/2019   Class 2 severe obesity due to excess calories with serious comorbidity and body mass index (BMI) of 36.0 to 36.9 in adult (HCC) 09/08/2019   HTN, goal below 130/80 09/08/2019   Chronic low back pain 05/29/2019     THERAPY DIAG:  Abnormal posture  Radiculopathy, lumbar region  Other low back pain  Difficulty in walking,  not elsewhere classified  Chronic pain of right knee   PCP: Aliene Beams, MD   REFERRING PROVIDER: Tarry Kos, MD  REFERRING DIAG:  Diagnosis  M25.561,G89.29 (ICD-10-CM) - Chronic pain of right knee  M54.41,G89.29 (ICD-10-CM) - Chronic right-sided low back pain with right-sided sciatica  M17.11 (ICD-10-CM) - Primary osteoarthritis of right knee      Rationale for Evaluation and Treatment: Rehabilitation   THERAPY DIAG:  Abnormal posture - Plan: PT plan of care cert/re-cert   Radiculopathy, lumbar region - Plan: PT plan of care cert/re-cert   Other low back pain - Plan: PT plan of care cert/re-cert   Difficulty in walking, not elsewhere classified - Plan: PT plan of care cert/re-cert   Chronic pain of right knee - Plan: PT plan of care cert/re-cert   ONSET DATE: Anne [name] notes a 6 month history of low back, right thigh and right knee pain.     SUBJECTIVE:  SUBJECTIVE STATEMENT: Anne [name] notes the only back and right sciatic pain over the past week was after 3 days of work and spending "off" time helping her sick mother.  Pain never got above a level of 3/10.  She still has not taken any additional pain medication this week.  Knees are better overall, although knees are most limiting, particularly late in her workday or on her 2nd or 3rd day in a row.  She is getting better at avoiding knee hyper-extension with standing.   PERTINENT HISTORY:  Severe Rt knee OA, HLD, HTN, pre-diabetes   PAIN:  Yes: NPRS scale: 3-5/10 bilateral knees, low back and right sciatica to the lateral thigh 0-3/10 Pain location: Low back, right thigh and right > left knee Pain description: Ache, sore Aggravating factors: Work, movement, hyper-extension Relieving factors: Rest, heat, over the counter pain medication    PRECAUTIONS: Back   RED FLAGS: None      WEIGHT BEARING RESTRICTIONS: No   FALLS:  Has patient fallen in last 6 months? 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   NEXT MD VISIT: NA   OBJECTIVE:    DIAGNOSTIC FINDINGS:  Right knee: X-rays demonstrate severe osteoarthritis.  Bone-on-bone joint space  narrowing with valgus deformity   Low back: X-rays of the lumbar spine show diffuse degenerative changes including a  degenerative L4-5 spondylolisthesis grade 2   PATIENT SURVEYS:  05/11/2023: FOTO 45  04/27/2023: FOTO 42  Eval: FOTO 36 (Goal 46 in 14 visits)   SCREENING FOR RED FLAGS: Bowel or bladder incontinence: No Spinal tumors: No Cauda equina syndrome: No Compression fracture: No Abdominal aneurysm: No   COGNITION: Overall cognitive status: Within functional limits for tasks assessed                          SENSATION: Anne [name] notes daily right sciatica as distal as the right knee   MUSCLE LENGTH: 05/11/2023: Hamstrings: Right 50 deg; Left 50 deg  04/27/2023: Hamstrings: Right 50 deg; Left 50 deg  Eval: Hamstrings: Right 50 deg; Left 50 deg   POSTURE: rounded shoulders, forward head, and decreased lumbar lordosis     LUMBAR ROM:    AROM 03/30/2023 04/27/2023 05/11/2023  Flexion      Extension 0 0 15  Right lateral flexion      Left lateral flexion      Right rotation      Left rotation       (Blank rows = not tested)   LOWER EXTREMITY ROM:      Passive  Left/Right 03/30/2023  Left/Right 04/27/2023 Left/Right 05/11/2023  Hip flexion 80/80  90/90 95/95  Hip extension       Hip abduction       Hip adduction       Hip internal rotation 4/8   4/9  Hip  external rotation 20/20   29/28  Knee flexion 112/116  114/117 115/117  Knee extension 0/0  0/0 0/0  Ankle dorsiflexion       Ankle plantarflexion       Ankle inversion       Ankle eversion        (  Blank rows = not tested)   LOWER EXTREMITY STRENGTH:  Deferred at evaluation secondary to time   MMT Left/Right 03/30/2023  Left/Right 04/27/2023 Left/Right 05/11/2023  Hip flexion       Hip extension       Hip abduction       Hip adduction       Hip internal rotation       Hip external rotation       Knee flexion       Knee extension   66.2/ 65.9 39.0/73.2  Ankle dorsiflexion       Ankle plantarflexion       Ankle inversion       Ankle eversion        (Blank rows = not tested)     GAIT: 04/18/2023:  Independent ambulation with noted antalgic gait with deviation of Rt leg stance with hyperextension/valgus noted on both knees but Rt more than Lt.     Eval: Distance walked: 50 feet Assistive device utilized: None Level of assistance: Complete Independence Comments: Mild flexed posture and valgus right knee noted   TODAY'S TREATMENT:                                                                   DATE:   05/17/2023 Seated straight leg raises 3 sets of 5 for 3 seconds with 1.5# Hip hike at counter top 2 sets of 10 x 3 seconds, work on avoiding knee hyper-extension and lateral lean Bridging 10 x 5 seconds  Functional Activities: Double leg press with emphasis on full extension and avoiding knee hyper-extension 15 x 87# slow eccentrics Single leg press with emphasis on full extension and avoiding knee hyper-extension 15 x 50# slow eccentrics Discussed her appointment with Dr Roda Shutters and PT recommendations to prepare for surgery   05/11/2023 Seated straight leg raises 3 sets of 5 for 3 seconds with 1.5# Hip hike at counter top 2 sets of 10 x 3 seconds, work on avoiding knee hyper-extension and lateral lean Bridging 10 x 5 seconds  Functional Activities: Double leg press with  emphasis on full extension and avoiding knee hyper-extension 15 x 87# slow eccentrics Single leg press with emphasis on full extension and avoiding knee hyper-extension 15 x 62# slow eccentrics Progress note review and HEP update   05/10/2023 NuStep Level 7 for 8 minutes with heat on low back Seated straight leg raises 3 sets of 5 for 3 seconds with 1# Hip hike at counter top 2 sets of 10 x 3 seconds, work on avoiding knee hyper-extension and lateral lean Limited range sit to stand in parallel bars 5 x focus on hips back like sitting in a chair, 3 second hold and avoid knee hyperextension  Functional Activities: Double leg press with emphasis on full extension and avoiding knee hyper-extension 15 x 87# slow eccentrics Single leg press with emphasis on full extension and avoiding knee hyper-extension 15 x 62# slow eccentrics   PATIENT EDUCATION:  Education details: See above Person educated: Patient Education method: Explanation, Demonstration, Tactile cues, Verbal cues, and Handouts Education comprehension: verbalized understanding, returned demonstration, verbal cues required, tactile cues required, and needs further education  HOME EXERCISE PROGRAM: Access Code: [email or web address] URL: [email or web address] Date: 05/11/2023 Prepared by: Pauletta Browns  Exercises - Standing Lumbar Extension at Wall - Forearms  - 5 x daily - 7 x weekly - 1 sets - 5 reps - 3 seconds hold - Standing Scapular Retraction  - 5 x daily - 7 x weekly - 1 sets - 5 reps - 5 second hold - Standing Hip Hiking  - 3-5 x daily - 7 x weekly - 1 sets - 10 reps - 3 seconds hold - Yoga Bridge  - 2 x daily - 7 x weekly - 1 sets - 10 reps - 5 seconds hold - Modified [name] Stretch  - 2 x daily - 1 x weekly - 4-5 reps - 20 seconds hold - Supine Hamstring Stretch  - 2 x daily - 1 x weekly - 1 sets - 4-5 reps - 20 seconds hold - Supine Figure 4 Piriformis Stretch  - 2 x daily - 1 x weekly - 1 sets - 4-5 reps - 20  seconds hold - Small Range Straight Leg Raise  - 2 x daily - 7 x weekly - 3-5 sets - 5 reps - 3 seconds hold - Mini Squats at Table  - 1 x daily - 7 x weekly - 1 sets - 5-10 reps  ASSESSMENT:   CLINICAL IMPRESSION: We did not have a lot of time for progressions today as Angeliah arrived 12 minutes late.  We also spent time discussing her appointment with Dr. Roda Shutters and that she is likely a TKA candidate.  Jeann would like to take advantage of her last visit or 2 to get completely comfortable with her home exercises before transferring into independent rehabilitation and deciding on when she wants to undergo TKA.  She did need some correction with her home exercises and will benefit from another visit or 2 to get completely independent with these before discharge into independent rehabilitation.   OBJECTIVE IMPAIRMENTS: Abnormal gait, decreased activity tolerance, decreased endurance, decreased knowledge of condition, difficulty walking, decreased ROM, decreased strength, decreased safety awareness, increased edema, impaired perceived functional ability, increased muscle spasms, impaired flexibility, improper body mechanics, postural dysfunction, obesity, and pain.    ACTIVITY LIMITATIONS: lifting, bending, standing, squatting, stairs, and locomotion level   PARTICIPATION LIMITATIONS: interpersonal relationship, community activity, and occupation   PERSONAL FACTORS: Severe Rt knee OA, HLD, HTN, pre-diabetes are also affecting patient's functional outcome.    REHAB POTENTIAL: Good   CLINICAL DECISION MAKING: Stable/uncomplicated   EVALUATION COMPLEXITY: Moderate     GOALS: Goals reviewed with patient? Yes   SHORT TERM GOALS: Target date: 04/26/2024   Daphnee will be independent with her day 1 HEP Baseline: Started 03/29/2023 Goal status: Met 05/03/2023   2.  Improve lumbar extension active range of motion to at least 5 degrees Baseline: 0 degrees Goal status: Met 05/11/2023   3.  Improve  bilateral hip flexion active range of motion to at least 90 degrees and external rotation to at least 30 degrees Baseline: 80 degrees and 20 degrees respectively Goal status: Partially Met 05/11/2023     LONG TERM GOALS: Target date: 06/08/2023   Improve FOTO to 46 Baseline: 36 Goal status: On Going 05/11/2023   2.  Daneen will report low back and bilateral knee pain pain consistently 0-3/10 on the Numeric Pain Rating Scale Baseline: 4-6/10 Goal status: Partially met 05/11/2023   3.  Improve bilateral quadriceps and spine strength as assessed by FOTO scores and objective measures Baseline: FOTO 36, objective measures deferred secondary to time at evaluation Goal status: Partially met 05/11/2023  4.  Ruben will report improved weight-bearing function at home at work as a result of improved back and knee strength and pain Baseline: Limited with weight-bearing function at home and work Goal status: Partially met 05/11/2023   5. Tondalaya will be independent with her long-term maintenance HEP at [facility]. Baseline: Started 03/30/2023 Goal status: On Going 05/17/2023   PLAN:   PT FREQUENCY: 1-2x/week   PT DURATION: 2-3 weeks   PLANNED INTERVENTIONS: Therapeutic exercises, Therapeutic activity, Neuromuscular re-education, Balance training, Gait training, Patient/Family education, Self Care, Stair training, Dry Needling, Cryotherapy, Traction, and Manual therapy.   PLAN FOR NEXT SESSION: Progress back and quadriceps strength, avoid knee hyper-extension.  Review posture and body mechanics PRN.    Cherlyn Cushing PT, MPT 05/17/23  1:39 PM

## 2023-05-18 ENCOUNTER — Ambulatory Visit: Payer: 59 | Attending: Obstetrics and Gynecology | Admitting: Physical Therapy

## 2023-05-18 DIAGNOSIS — M62838 Other muscle spasm: Secondary | ICD-10-CM | POA: Diagnosis not present

## 2023-05-18 DIAGNOSIS — M6281 Muscle weakness (generalized): Secondary | ICD-10-CM | POA: Diagnosis not present

## 2023-05-18 NOTE — Therapy (Signed)
OUTPATIENT PHYSICAL THERAPY FEMALE PELVIC EVALUATION   Patient Name: Anne [name] MRN: [id number] DOB:10/19/1955, 67 y.o., female Today's Date: 05/18/2023  END OF SESSION:  PT End of Session - 05/18/23 0839     Visit Number 14    Date for PT Re-Evaluation 05/25/23    Authorization Type AETNA Save/Medicare secondary    PT Start Time 0800    PT Stop Time 0845    PT Time Calculation (min) 45 min    Activity Tolerance Patient tolerated treatment well    Behavior During Therapy WFL for tasks assessed/performed                Past Medical History:  Diagnosis Date   Arthritis of knee    right knee   Hyperlipidemia    Hypertension    followed by pcp  and cardiology (dr h. Katrinka Blazing--- coronary CT 09-01-2020  calcium score zero and mild prox LAD calcification)   PMB (postmenopausal bleeding)    Pre-diabetes    followed by pcp   Prediabetes    Past Surgical History:  Procedure Laterality Date   CESAREAN SECTION  1988   COLONOSCOPY     DILATATION & CURETTAGE/HYSTEROSCOPY WITH MYOSURE N/A 10/22/2020   Procedure: DILATATION & CURETTAGE/HYSTEROSCOPY WITH MYOSURE;  Surgeon: Maxie Better, MD;  Location: [facility];  Service: Gynecology;  Laterality: N/A;   Patient Active Problem List   Diagnosis Date Noted   Primary osteoarthritis of left knee 05/15/2023   Body mass index 40.0-44.9, adult (HCC) 05/15/2023   Primary osteoarthritis of right knee 03/20/2023   Chronic pain of right knee 03/20/2023   Prediabetes 09/09/2019   Vitamin D insufficiency 09/09/2019   Class 2 severe obesity due to excess calories with serious comorbidity and body mass index (BMI) of 36.0 to 36.9 in adult (HCC) 09/08/2019   HTN, goal below 130/80 09/08/2019   Chronic low back pain 05/29/2019    PCP: Aliene Beams, MD PCP - General  REFERRING PROVIDER: Marguerita Beards, MD   REFERRING DIAG:  N39.3 (ICD-10-CM) - SUI (stress urinary incontinence, female)  N32.81  (ICD-10-CM) - Overactive bladder  N81.10 (ICD-10-CM) - Prolapse of anterior vaginal wall    THERAPY DIAG:  Other muscle spasm  Muscle weakness (generalized)  Rationale for Evaluation and Treatment: Rehabilitation  ONSET DATE: 09/2022 when it got worse  SUBJECTIVE:    05/18/23  Pt comes 12 minutes late, last time she was here was 5 weeks ago, Has been focusing on knee and back rehab. Pt reports that she has been doing ortho PT for her knee, she is using less pads ( 2 in 24 hrs)  still doing her exercises. Work is still stressful, mom is still is in the hospital. She is trying to go  to bathroom more often at work. She is considering right THA.     Pt reports that she is actually feeling better, feels like the exercises are helping. She is surprised. Back is hurting and her knee is hurting, has had a lot of stress at works and family members in the hospital.  PAIN:  Are you having pain? Yes NPRS scale: 7/10 with Advil  Pain location:  low back  Pain type: aching, shooting, right leg, right LOWER EXTREMITY is turning in and it's bothering her Pain description: intermittent   Aggravating factors: walking, moving, being on her feet a lot Relieving factors: rest, heat  PRECAUTIONS: Other:    RED FLAGS: None   WEIGHT BEARING RESTRICTIONS: No  FALLS:  Has patient fallen in last 6 months? No  LIVING ENVIRONMENT: Lives with: lives with their family and lives alone Lives in: House/apartment OCCUPATION: nurse   PLOF: Independent  PATIENT GOALS: wants to be able to be herself, not feel like she has to  monitor herself, get her back and her leg straight  PERTINENT HISTORY:  Chronic back pain, right le weakness Sexual abuse: No  BOWEL MOVEMENT: Pain with bowel movement: No Type of bowel  movement:Type (Bristol Stool Scale) 3 Fully empty rectum: Yes: after colonscopy Leakage: No Pads: No Fiber supplement: No makes her constipated  URINATION: Pain with urination: Yes Fully empty bladder: No leaks when she stands up Stream: Strong depends how bad she has to go Urgency: Yes:   Frequency: on weekends can go to bathroom faster, holds it long time, drinks more liquids Leakage: Urge to void, Walking to the bathroom, Coughing, Sneezing, Laughing, Exercise, Lifting, and Bending forward Pads: Yes: 2 maxipads at work , has to [employer] them. Can be soaked. She just can't go to the bathroom, has some over flow   PREGNANCY: Vaginal deliveries 0  C-section deliveries 2 Currently pregnant No  PROLAPSE: To be assessed  OBJECTIVE:   DIAGNOSTIC FINDINGS:  Weakness and pain bilat hips Difficulty with walking and mobility Low back aggravated with sciatica today- limits eval Able to engage abdomen and reported pelvic floor lift with knack and contraction    COGNITION: Overall cognitive status: Within functional limits for tasks assessed     SENSATION: Light touch: Appears intact Proprioception: Appears intact   LUMBAR SPECIAL TESTS:  Straight leg raise test: Positive  POSTURE: rounded shoulders and increased lumbar lordosis  PELVIC ALIGNMENT:     LOWER EXTREMITY ROM:  Passive ROM Right eval Left eval  Hip flexion [facility]  Hip extension    Hip abduction    Hip adduction    Hip internal rotation    Hip external rotation    Knee flexion    Knee extension    Ankle dorsiflexion    Ankle plantarflexion    Ankle inversion    Ankle eversion     (Blank rows = not tested)  LOWER EXTREMITY MMT:  MMT Right eval Left eval  Hip flexion 3/5 4/5  Hip extension    Hip abduction    Hip adduction    Hip internal rotation    Hip external rotation    Knee flexion    Knee extension    Ankle dorsiflexion    Ankle plantarflexion    Ankle inversion    Ankle  eversion      Patient confirms identification and approves PT to assess internal pelvic floor and treatment Yes  PELVIC MMT:   MMT eval  Vaginal 2/5  Internal Anal Sphincter   External Anal Sphincter   Puborectalis   Diastasis Recti no  (Blank rows = not tested)        TODAY'S TREATMENT:  DATE: 05/18/23      Therapeutic exercises- ball press supine with TRANSVERSE ABDOMINIS BREATH     Therapeutic activities- pain neuroscience,     Neuromuscular re-education: diaphragmatic breathing education- supine with bent knees, thera band, ball press    Therapeutic activities: timed voiding, education on core and pelvic floor cocontraction/ abdominal pressures       PATIENT EDUCATION:  Education details: relevant anatomy, urge suppression techniques, the knack  Person educated: Patient Education method: Explanation, Demonstration, Tactile cues, Verbal cues, and Handouts Education comprehension: verbalized understanding and needs further education  HOME EXERCISE PROGRAM: [email or web address]  ASSESSMENT: 05/18/23   CLINICAL IMPRESSION: Pt with limited time for exercises today d/t being late. She seems to have made some progress d/t changing voiding habits at work. She will benefit from cont PT to reduce urinary incontinence.   Pt with some difficulty today with awareness of breath and abdominal and pelvic floor contraction, tends to demonstrate upper chest breathing and required VC's, TC's. Added DB to HEP.    OBJECTIVE IMPAIRMENTS: decreased coordination, decreased mobility, difficulty walking, decreased ROM, decreased strength, hypomobility, impaired sensation, obesity, and pain.   ACTIVITY LIMITATIONS: standing and transfers  PARTICIPATION LIMITATIONS: occupation  PERSONAL FACTORS: Age, Fitness, and Time since onset of injury/illness/exacerbation are  also affecting patient's functional outcome.   REHAB POTENTIAL: Good  CLINICAL DECISION MAKING: Stable/uncomplicated  EVALUATION COMPLEXITY: Low   GOALS: Goals reviewed with patient? Yes  SHORT TERM GOALS: Target date: 04/26/2023    Pt will be independent with the knack, urge suppression technique, and double voiding in order to improve bladder habits and decrease urinary incontinence.   Baseline: Goal status: INITIAL  2.  Pt to demonstrate improved coordination of pelvic floor and breathing mechanics with 10# squat with appropriate synergistic patterns to decrease pain and leakage at least 75% of the time.    Baseline:  Goal status: INITIAL  3.  Pt will be independent with HEP.   Baseline:  Goal status: INITIAL  LONG TERM GOALS: Target date: 06/21/2023    Pt will be independent with advanced HEP.   Baseline:  Goal status: INITIAL  2.  Pt will demonstrate 4/5 pelvic floor muscle strength with appropriate coordination in order to decrease urinary incontinence, urgency and frequency.   Baseline:  Goal status: INITIAL  3.  Patient will use the restroom at work more then 1 in 12 hrs Baseline:  Goal status: INITIAL   PLAN:  PT FREQUENCY: 1x/week  PT DURATION: 12 weeks  PLANNED INTERVENTIONS: Therapeutic exercises, Therapeutic activity, Neuromuscular re-education, Balance training, Gait training, Patient/Family education, Self Care, Joint mobilization, Dry Needling, Electrical stimulation, Spinal manipulation, Spinal mobilization, scar mobilization, Biofeedback, and Manual therapy  PLAN FOR NEXT SESSION: internal   Aleck Locklin, PT 05/18/2023, 8:44 AM

## 2023-05-20 ENCOUNTER — Ambulatory Visit (HOSPITAL_COMMUNITY)
Admission: EM | Admit: 2023-05-20 | Discharge: 2023-05-20 | Disposition: A | Discharge status: Home / Self Care | Payer: 59 | Attending: Family Medicine | Admitting: Family Medicine

## 2023-05-20 ENCOUNTER — Encounter (HOSPITAL_COMMUNITY): Payer: Self-pay | Admitting: Emergency Medicine

## 2023-05-20 ENCOUNTER — Other Ambulatory Visit: Payer: Self-pay

## 2023-05-20 DIAGNOSIS — L239 Allergic contact dermatitis, unspecified cause: Secondary | ICD-10-CM

## 2023-05-20 MED ORDER — TRIAMCINOLONE ACETONIDE 0.1 % EX CREA: 1.0000 | TOPICAL_CREAM | Freq: Two times a day (BID) | CUTANEOUS | 0 refills | Status: AC

## 2023-05-20 NOTE — Discharge Instructions (Addendum)
Triamcinolone cream--apply 2 times daily to the rash area until better, about 10 to 14 days.

## 2023-05-20 NOTE — ED Provider Notes (Signed)
[facility]    CSN: [id number] Arrival date & time: 05/20/23  1523      History   Chief Complaint Chief Complaint  Patient presents with   Rash    HPI Anne [name] is a 67 y.o. female.    Rash Here for rash on her upper right arm.  She first noticed it this morning.  It has been itching some.  It is not hurting or tingling at all.  She is very concerned that it might be shingles, since she has had a lot of stress recently    Past Medical History:  Diagnosis Date   Arthritis of knee    right knee   Hyperlipidemia    Hypertension    followed by pcp  and cardiology (dr h. Katrinka Blazing--- coronary CT 09-01-2020  calcium score zero and mild prox LAD calcification)   PMB (postmenopausal bleeding)    Pre-diabetes    followed by pcp   Prediabetes     Patient Active Problem List   Diagnosis Date Noted   Primary osteoarthritis of left knee 05/15/2023   Body mass index 40.0-44.9, adult (HCC) 05/15/2023   Primary osteoarthritis of right knee 03/20/2023   Chronic pain of right knee 03/20/2023   Prediabetes 09/09/2019   Vitamin D insufficiency 09/09/2019   Class 2 severe obesity due to excess calories with serious comorbidity and body mass index (BMI) of 36.0 to 36.9 in adult (HCC) 09/08/2019   HTN, goal below 130/80 09/08/2019   Chronic low back pain 05/29/2019    Past Surgical History:  Procedure Laterality Date   CESAREAN SECTION  1988   COLONOSCOPY     DILATATION & CURETTAGE/HYSTEROSCOPY WITH MYOSURE N/A 10/22/2020   Procedure: DILATATION & CURETTAGE/HYSTEROSCOPY WITH MYOSURE;  Surgeon: Maxie Better, MD;  Location: [facility];  Service: Gynecology;  Laterality: N/A;    OB History     Gravida  2   Para  2   Term  2   Preterm      AB      Living         SAB      IAB      Ectopic      Multiple      Live Births               Home Medications    Prior to Admission medications   Medication Sig Start Date End  Date Taking? Authorizing Provider  triamcinolone cream (KENALOG) 0.1 % Apply 1 Application topically 2 (two) times daily. To affected area till better 05/20/23  Yes Hailei Besser, Janace Aris, MD  Acetaminophen 500 MG capsule as needed.    [provider]  amLODipine (NORVASC) 5 MG tablet Take 1 tablet (5 mg total) by mouth daily. Patient not taking: Reported on 05/20/2023 05/18/21     diclofenac Sodium (VOLTAREN) 1 % GEL Apply topically 4 (four) times daily as needed.    [provider]  ibuprofen (ADVIL) 200 MG tablet as needed.    [provider]  Multiple Vitamin (MULTIVITAMIN) tablet Take 1 tablet by mouth daily.    [provider]  olmesartan-hydrochlorothiazide (BENICAR HCT) 20-12.5 MG tablet Take 1 tablet by mouth daily. 12/11/22     rosuvastatin (CRESTOR) 20 MG tablet Take 1 tablet (20 mg total) by mouth daily. 11/16/22 06/14/23  Swinyer, Zachary George, NP  vitamin C (ASCORBIC ACID) 500 MG tablet as needed.    [provider]  Zinc 100 MG TABS  Take 1 tablet by mouth daily.    [provider]    Family History Family History  Problem Relation Age of Onset   High Cholesterol Mother    Heart disease Mother    Obesity Mother    Heart disease Father    Diabetes Sister    Diverticulitis Sister    Alcohol abuse Brother    Liver disease Brother    COPD Brother     Social History Social History   Tobacco Use   Smoking status: Never   Smokeless tobacco: Never  Vaping Use   Vaping status: Never Used  Substance Use Topics   Alcohol use: Not Currently   Drug use: Never     Allergies   Benadryl [diphenhydramine] and Tetanus toxoids   Review of Systems Review of Systems  Skin:  Positive for rash.     Physical Exam Triage Vital Signs ED Triage Vitals  Encounter Vitals Group     BP 05/20/23 1646 (!) 198/78     Systolic BP Percentile --      Diastolic BP Percentile --      Pulse Rate 05/20/23 1646 77     Resp 05/20/23 1646 20      Temp 05/20/23 1646 99.1 F (37.3 C)     Temp Source 05/20/23 1646 Oral     SpO2 05/20/23 1646 96 %     Weight --      Height --      Head Circumference --      Peak Flow --      Pain Score 05/20/23 1644 0     Pain Loc --      Pain Education --      Exclude from Growth Chart --    No data found.  Updated Vital Signs BP (!) 190/75 (BP Location: Right Arm) Comment (BP Location): large cuff  Pulse 77   Temp 99.1 F (37.3 C) (Oral)   Resp 20   SpO2 96%   Visual Acuity Right Eye Distance:   Left Eye Distance:   Bilateral Distance:    Right Eye Near:   Left Eye Near:    Bilateral Near:     Physical Exam Vitals reviewed.  Constitutional:      General: She is not in acute distress.    Appearance: She is not ill-appearing, toxic-appearing or diaphoretic.  Skin:    Coloration: Skin is not jaundiced or pale.     Comments: There is a bumpy red rash in a circular pattern on her right upper anterior arm.  At this time they are not vesicles.  Neurological:     General: No focal deficit present.     Mental Status: She is alert and oriented to person, place, and time.  Psychiatric:        Behavior: Behavior normal.      UC Treatments / Results  Labs (all labs ordered are listed, but only abnormal results are displayed) Labs Reviewed - No data to display  EKG   Radiology No results found.  Procedures Procedures (including critical care time)  Medications Ordered in UC Medications - No data to display  Initial Impression / Assessment and Plan / UC Course  I have reviewed the triage vital signs and the nursing notes.  Pertinent labs & imaging results that were available during my care of the patient were reviewed by me and considered in my medical decision making (see chart for details).     Triamcinolone cream is  sent in for this dermatitis that appears to be allergic or contact in origin.  This does not seem to be shingles since she is not hurting at  all. Final Clinical Impressions(s) / UC Diagnoses   Final diagnoses:  Allergic contact dermatitis, unspecified trigger     Discharge Instructions      Triamcinolone cream--apply 2 times daily to the rash area until better, about 10 to 14 days.       ED Prescriptions     Medication Sig Dispense Auth. Provider   triamcinolone cream (KENALOG) 0.1 % Apply 1 Application topically 2 (two) times daily. To affected area till better 80 g Marlinda Mike Janace Aris, MD      PDMP not reviewed this encounter.   Zenia Resides, MD 05/20/23 [phone]

## 2023-05-20 NOTE — ED Triage Notes (Signed)
This morning, noticed a rash and itchiness this morning.  Rash to right anterior shoulder.  Site itches.  Denies pain.    Has not used any medications  Patient is specifically concerned about shingles

## 2023-05-22 ENCOUNTER — Other Ambulatory Visit: Payer: 59

## 2023-05-22 DIAGNOSIS — E7800 Pure hypercholesterolemia, unspecified: Secondary | ICD-10-CM | POA: Diagnosis not present

## 2023-05-24 ENCOUNTER — Encounter: Payer: Self-pay | Admitting: Rehabilitative and Restorative Service Providers"

## 2023-05-24 ENCOUNTER — Ambulatory Visit (INDEPENDENT_AMBULATORY_CARE_PROVIDER_SITE_OTHER): Payer: 59 | Admitting: Rehabilitative and Restorative Service Providers"

## 2023-05-24 ENCOUNTER — Ambulatory Visit: Payer: 59 | Admitting: Physical Therapy

## 2023-05-24 DIAGNOSIS — M5459 Other low back pain: Secondary | ICD-10-CM

## 2023-05-24 DIAGNOSIS — R293 Abnormal posture: Secondary | ICD-10-CM | POA: Diagnosis not present

## 2023-05-24 DIAGNOSIS — M5416 Radiculopathy, lumbar region: Secondary | ICD-10-CM

## 2023-05-24 DIAGNOSIS — R262 Difficulty in walking, not elsewhere classified: Secondary | ICD-10-CM | POA: Diagnosis not present

## 2023-05-24 DIAGNOSIS — M6281 Muscle weakness (generalized): Secondary | ICD-10-CM | POA: Diagnosis not present

## 2023-05-24 NOTE — Therapy (Addendum)
OUTPATIENT PHYSICAL THERAPY TREATMENT NOTE / DISCHARGE    Patient Name: Anne [name] MRN: [id number] DOB:07-26-1955, 67 y.o., female Today's Date: 05/25/2023   END OF SESSION:   PT End of Session - 05/25/23 0825     Visit Number 16    Date for PT Re-Evaluation 05/25/23    Authorization Type [employer] Save/Medicare secondary    Progress Note Due on Visit 19    PT Start Time 0803    PT Stop Time 0834    PT Time Calculation (min) 31 min    Activity Tolerance Patient tolerated treatment well    Behavior During Therapy [facility] for tasks assessed/performed               Past Medical History:  Diagnosis Date   Arthritis of knee    right knee   Hyperlipidemia    Hypertension    followed by pcp  and cardiology (dr h. Katrinka Blazing--- coronary CT 09-01-2020  calcium score zero and mild prox LAD calcification)   PMB (postmenopausal bleeding)    Pre-diabetes    followed by pcp   Prediabetes    Past Surgical History:  Procedure Laterality Date   CESAREAN SECTION  1988   COLONOSCOPY     DILATATION & CURETTAGE/HYSTEROSCOPY WITH MYOSURE N/A 10/22/2020   Procedure: DILATATION & CURETTAGE/HYSTEROSCOPY WITH MYOSURE;  Surgeon: Maxie Better, MD;  Location: [facility];  Service: Gynecology;  Laterality: N/A;   Patient Active Problem List   Diagnosis Date Noted   Primary osteoarthritis of left knee 05/15/2023   Body mass index 40.0-44.9, adult (HCC) 05/15/2023   Primary osteoarthritis of right knee 03/20/2023   Chronic pain of right knee 03/20/2023   Prediabetes 09/09/2019   Vitamin D insufficiency 09/09/2019   Class 2 severe obesity due to excess calories with serious comorbidity and body mass index (BMI) of 36.0 to 36.9 in adult (HCC) 09/08/2019   HTN, goal below 130/80 09/08/2019   Chronic low back pain 05/29/2019     THERAPY DIAG:  Abnormal posture  Radiculopathy, lumbar region  Other low back pain  Difficulty in walking, not elsewhere classified  Chronic  pain of right knee   PCP: Aliene Beams, MD   REFERRING PROVIDER: Tarry Kos, MD  REFERRING DIAG:  Diagnosis  M25.561,G89.29 (ICD-10-CM) - Chronic pain of right knee  M54.41,G89.29 (ICD-10-CM) - Chronic right-sided low back pain with right-sided sciatica  M17.11 (ICD-10-CM) - Primary osteoarthritis of right knee      Rationale for Evaluation and Treatment: Rehabilitation   THERAPY DIAG:  Abnormal posture - Plan: PT plan of care cert/re-cert   Radiculopathy, lumbar region - Plan: PT plan of care cert/re-cert   Other low back pain - Plan: PT plan of care cert/re-cert   Difficulty in walking, not elsewhere classified - Plan: PT plan of care cert/re-cert   Chronic pain of right knee - Plan: PT plan of care cert/re-cert   ONSET DATE: Anne [name] notes a 6 month history of low back, right thigh and right knee pain.     SUBJECTIVE:  SUBJECTIVE STATEMENT: Pt indicated having some pain upon arrival today.  Reported 6/10   PERTINENT HISTORY:  Severe Rt knee OA, HLD, HTN, pre-diabetes   PAIN:  Yes: NPRS scale: 6/10 Pain location: Rt knee Pain description: Ache, sore Aggravating factors: Work, movement, hyper-extension Relieving factors: Rest, heat, over the counter pain medication   PRECAUTIONS: Back   RED FLAGS: None      WEIGHT BEARING RESTRICTIONS: No   FALLS:  Has patient fallen in last 6 months? 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   NEXT MD VISIT: NA   OBJECTIVE:    DIAGNOSTIC  FINDINGS:  Right knee: X-rays demonstrate severe osteoarthritis.  Bone-on-bone joint space  narrowing with valgus deformity   Low back: X-rays of the lumbar spine show diffuse degenerative changes including a  degenerative L4-5 spondylolisthesis grade 2   PATIENT SURVEYS:  05/25/2023: FOTO update:  45  05/11/2023: FOTO 45  04/27/2023: FOTO 42  Eval: FOTO 36 (Goal 46 in 14 visits)   SCREENING FOR RED FLAGS: Bowel or bladder incontinence: No Spinal tumors: No Cauda equina syndrome: No Compression fracture: No Abdominal aneurysm: No   COGNITION: Overall cognitive status: Within functional limits for tasks assessed                          SENSATION: Anne [name] notes daily right sciatica as distal as the right knee   MUSCLE LENGTH: 05/11/2023: Hamstrings: Right 50 deg; Left 50 deg  04/27/2023: Hamstrings: Right 50 deg; Left 50 deg  Eval: Hamstrings: Right 50 deg; Left 50 deg   POSTURE: rounded shoulders, forward head, and decreased lumbar lordosis     LUMBAR ROM:    AROM 03/30/2023 04/27/2023 05/11/2023  Flexion      Extension 0 0 15  Right lateral flexion      Left lateral flexion      Right rotation      Left rotation       (Blank rows = not tested)   LOWER EXTREMITY ROM:      Passive  Left/Right 03/30/2023  Left/Right 04/27/2023 Left/Right 05/11/2023  Hip flexion 80/80  90/90 95/95  Hip extension       Hip abduction       Hip adduction       Hip internal rotation 4/8   4/9  Hip external rotation 20/20   29/28  Knee flexion 112/116  114/117 115/117  Knee extension 0/0  0/0 0/0  Ankle dorsiflexion       Ankle plantarflexion       Ankle inversion       Ankle eversion        (Blank rows = not tested)   LOWER EXTREMITY STRENGTH:  Deferred at evaluation secondary to time   MMT Left/Right 03/30/2023  Left/Right 04/27/2023 Left/Right 05/11/2023  Hip flexion       Hip extension       Hip abduction       Hip adduction       Hip internal rotation       Hip  external rotation       Knee flexion  Knee extension   66.2/ 65.9 39.0/73.2  Ankle dorsiflexion       Ankle plantarflexion       Ankle inversion       Ankle eversion        (Blank rows = not tested)     GAIT: 04/18/2023:  Independent ambulation with noted antalgic gait with deviation of Rt leg stance with hyperextension/valgus noted on both knees but Rt more than Lt.     Eval: Distance walked: 50 feet Assistive device utilized: None Level of assistance: Complete Independence Comments: Mild flexed posture and valgus right knee noted                  TODAY'S TREATMENT:                                                                   DATE:  05/25/2023 Therex Nustep lvl 5 12 mins   Verbal review of existing HEP for trial HEP period and continued use.  Question and answer time about surgery technique and benefits of continued HEP pre any surgical intervention for knee.    TODAY'S TREATMENT:                                                                   DATE:  05/24/2023 Seated straight leg raises 3 sets of 5 for 3 seconds with 2# Hip hike at counter top 2 sets of 10 x 5 seconds, work on avoiding knee hyper-extension and lateral lean Bridging 10 x 5 seconds Quadriceps sets 10 x 5 seconds Tailgate knee flexion 1 minute  Functional Activities: Double leg press with emphasis on full extension and avoiding knee hyper-extension 15 x 87# slow eccentrics Single leg press with emphasis on full extension and avoiding knee hyper-extension 15 x 50# slow eccentrics Discussed priorities post TKA: extension AROM, flexion AROM, quadriceps strength and edema control and added 2 exercises to focus on post-TKA (quad sets and knee flexion AROM)   TODAY'S TREATMENT:                                                                   DATE: 05/17/2023 Seated straight leg raises 3 sets of 5 for 3 seconds with 1.5# Hip hike at counter top 2 sets of 10 x 3 seconds, work on avoiding knee  hyper-extension and lateral lean Bridging 10 x 5 seconds  Functional Activities: Double leg press with emphasis on full extension and avoiding knee hyper-extension 15 x 87# slow eccentrics Single leg press with emphasis on full extension and avoiding knee hyper-extension 15 x 50# slow eccentrics Discussed her appointment with Dr Roda Shutters and PT recommendations to prepare for surgery   TODAY'S TREATMENT:  DATE: 05/11/2023 Seated straight leg raises 3 sets of 5 for 3 seconds with 1.5# Hip hike at counter top 2 sets of 10 x 3 seconds, work on avoiding knee hyper-extension and lateral lean Bridging 10 x 5 seconds  Functional Activities: Double leg press with emphasis on full extension and avoiding knee hyper-extension 15 x 87# slow eccentrics Single leg press with emphasis on full extension and avoiding knee hyper-extension 15 x 62# slow eccentrics Progress note review and HEP update   PATIENT EDUCATION:  05/25/2023 Education details: HEP update Person educated: Patient Education method: Explanation, Demonstration, Tactile cues, Verbal cues, and Handouts Education comprehension: verbalized understanding, returned demonstration, verbal cues required, tactile cues required, and needs further education  HOME EXERCISE PROGRAM: Access Code: [email or web address] URL: [email or web address] Date: 05/25/2023 Prepared by: Chyrel Masson  Exercises - Standing Lumbar Extension at Wall - Forearms  - 3-5 x daily - 7 x weekly - 1 sets - 5 reps - 3 seconds hold - Standing Scapular Retraction  - 3-5 x daily - 7 x weekly - 1 sets - 5 reps - 5 second hold - Modified [name] Stretch  - 1-2 x daily - 1 x weekly - 4-5 reps - 20 seconds hold - Supine Hamstring Stretch  - 1-2 x daily - 1 x weekly - 1 sets - 4-5 reps - 20 seconds hold - Supine Figure 4 Piriformis Stretch  - 1-2 x daily - 1 x weekly - 1 sets - 4-5 reps - 20 seconds hold - Yoga  Bridge  - 1-2 x daily - 7 x weekly - 1 sets - 10 reps - 5 seconds hold - Hip Flexor Stretch with Chair  - 1-2 x daily - 7 x weekly - 1 sets - 5 reps - 20 seconds hold - Seated Knee Flexion AAROM  - 3-5 x daily - 7 x weekly - 1 sets - 1 reps - 3 minutes hold - Standing Hip Hiking  - 1-2 x daily - 7 x weekly - 1 sets - 10 reps - 3 seconds hold - Prone Alternating Arm and Leg Lifts  - 1 x daily - 7 x weekly - 1 sets - 10 reps - 2-10 seconds hold - Seated Quad Set  - 1-2 x daily - 7 x weekly - 1 sets - 10 reps - 5 hold - Seated Straight Leg Heel Taps  - 1-2 x daily - 7 x weekly - 3 sets - 10 reps  ASSESSMENT:   CLINICAL IMPRESSION: Pt attended 16 visits overall during course of treatment with improvements in back symptoms indicated but continued Rt knee related symptoms noted.  Discussion over last few visits were centered on improving HEP confidence and possible discussion with MD office regarding any surgical planning.  Pt was in agreement with plan for continued HEP at this time while going through any surgical setup planning.  May return if necessary based off symptom presentation.  At this time on hold for 30 days with HEP trial.   OBJECTIVE IMPAIRMENTS: Abnormal gait, decreased activity tolerance, decreased endurance, decreased knowledge of condition, difficulty walking, decreased ROM, decreased strength, decreased safety awareness, increased edema, impaired perceived functional ability, increased muscle spasms, impaired flexibility, improper body mechanics, postural dysfunction, obesity, and pain.    ACTIVITY LIMITATIONS: lifting, bending, standing, squatting, stairs, and locomotion level   PARTICIPATION LIMITATIONS: interpersonal relationship, community activity, and occupation   PERSONAL FACTORS: Severe Rt knee OA, HLD, HTN, pre-diabetes are also affecting patient's functional outcome.    REHAB POTENTIAL:  Good   CLINICAL DECISION MAKING: Stable/uncomplicated   EVALUATION COMPLEXITY:  Moderate     GOALS: Goals reviewed with patient? Yes   SHORT TERM GOALS: Target date: 04/26/2024   Reggina will be independent with her day 1 HEP Baseline: Started 03/29/2023 Goal status: Met 05/03/2023   2.  Improve lumbar extension active range of motion to at least 5 degrees Baseline: 0 degrees Goal status: Met 05/11/2023   3.  Improve bilateral hip flexion active range of motion to at least 90 degrees and external rotation to at least 30 degrees Baseline: 80 degrees and 20 degrees respectively Goal status: Partially Met 05/11/2023     LONG TERM GOALS: Target date: 06/08/2023   Improve FOTO to 46 Baseline: 36 Goal status: partially met 05/25/2023   2.  Earica will report low back and bilateral knee pain pain consistently 0-3/10 on the Numeric Pain Rating Scale Baseline: 4-6/10 Goal status: Partially met 05/11/2023   3.  Improve bilateral quadriceps and spine strength as assessed by FOTO scores and objective measures Baseline: FOTO 36, objective measures deferred secondary to time at evaluation Goal status: Partially met 05/11/2023   4.  Skyelynn will report improved weight-bearing function at home at work as a result of improved back and knee strength and pain Baseline: Limited with weight-bearing function at home and work Goal status: Partially met 05/11/2023   5. Theria will be independent with her long-term maintenance HEP at [place]. Baseline: Started 03/30/2023 Goal status: Met 05/25/2023   PLAN:   PT FREQUENCY: 1-2x/week   PT DURATION: 2-3 weeks   PLANNED INTERVENTIONS: Therapeutic exercises, Therapeutic activity, Neuromuscular re-education, Balance training, Gait training, Patient/Family education, Self Care, Stair training, Dry Needling, Cryotherapy, Traction, and Manual therapy.   PLAN FOR NEXT SESSION:  Trial HEP 30 days.     Chyrel Masson, PT, DPT, OCS, ATC 05/25/23  8:38 AM    PHYSICAL THERAPY DISCHARGE SUMMARY  Visits from Start of Care: 16  Current  functional level related to goals / functional outcomes: See note   Remaining deficits: See note   Education / Equipment: HEP  Patient goals were partially met. Patient is being discharged due to not returning since the last visit.  Chyrel Masson, PT, DPT, OCS, ATC 06/28/23  10:43 AM

## 2023-05-24 NOTE — Therapy (Signed)
OUTPATIENT PHYSICAL THERAPY TREATMENT NOTE    Patient Name: Anne [name] MRN: [id number] DOB:14-Jun-1956, 67 y.o., female Today's Date: 05/24/2023   END OF SESSION:   PT End of Session - 05/24/23 0917     Visit Number 15    Date for PT Re-Evaluation 05/25/23    Authorization Type [employer] Save/Medicare secondary    Authorization - Visit Number 15    Progress Note Due on Visit 19    PT Start Time 0916    PT Stop Time 1003    PT Time Calculation (min) 47 min    Activity Tolerance Patient tolerated treatment well;No increased pain    Behavior During Therapy WFL for tasks assessed/performed              Past Medical History:  Diagnosis Date   Arthritis of knee    right knee   Hyperlipidemia    Hypertension    followed by pcp  and cardiology (dr h. Katrinka Blazing--- coronary CT 09-01-2020  calcium score zero and mild prox LAD calcification)   PMB (postmenopausal bleeding)    Pre-diabetes    followed by pcp   Prediabetes    Past Surgical History:  Procedure Laterality Date   CESAREAN SECTION  1988   COLONOSCOPY     DILATATION & CURETTAGE/HYSTEROSCOPY WITH MYOSURE N/A 10/22/2020   Procedure: DILATATION & CURETTAGE/HYSTEROSCOPY WITH MYOSURE;  Surgeon: Maxie Better, MD;  Location: [facility];  Service: Gynecology;  Laterality: N/A;   Patient Active Problem List   Diagnosis Date Noted   Primary osteoarthritis of left knee 05/15/2023   Body mass index 40.0-44.9, adult (HCC) 05/15/2023   Primary osteoarthritis of right knee 03/20/2023   Chronic pain of right knee 03/20/2023   Prediabetes 09/09/2019   Vitamin D insufficiency 09/09/2019   Class 2 severe obesity due to excess calories with serious comorbidity and body mass index (BMI) of 36.0 to 36.9 in adult (HCC) 09/08/2019   HTN, goal below 130/80 09/08/2019   Chronic low back pain 05/29/2019     THERAPY DIAG:  Muscle weakness (generalized)  Abnormal posture  Radiculopathy, lumbar region  Other low  back pain  Difficulty in walking, not elsewhere classified   PCP: Aliene Beams, MD   REFERRING PROVIDER: Tarry Kos, MD  REFERRING DIAG:  Diagnosis  M25.561,G89.29 (ICD-10-CM) - Chronic pain of right knee  M54.41,G89.29 (ICD-10-CM) - Chronic right-sided low back pain with right-sided sciatica  M17.11 (ICD-10-CM) - Primary osteoarthritis of right knee      Rationale for Evaluation and Treatment: Rehabilitation   THERAPY DIAG:  Abnormal posture - Plan: PT plan of care cert/re-cert   Radiculopathy, lumbar region - Plan: PT plan of care cert/re-cert   Other low back pain - Plan: PT plan of care cert/re-cert   Difficulty in walking, not elsewhere classified - Plan: PT plan of care cert/re-cert   Chronic pain of right knee - Plan: PT plan of care cert/re-cert   ONSET DATE: Sua notes a 6 month history of low back, right thigh and right knee pain.     SUBJECTIVE:  SUBJECTIVE STATEMENT: Anne [name] has decided to go ahead and get a TKA.  She is waiting on authorization.  She reports good HEP compliance.   PERTINENT HISTORY:  Severe Rt knee OA, HLD, HTN, pre-diabetes   PAIN:  Yes: NPRS scale: 3-5/10 bilateral knees, low back and right sciatica to the lateral thigh 0-3/10 Pain location: Low back, right thigh and right > left knee Pain description: Ache, sore Aggravating factors: Work, movement, hyper-extension Relieving factors: Rest, heat, over the counter pain medication   PRECAUTIONS: Back   RED FLAGS: None      WEIGHT BEARING RESTRICTIONS: No   FALLS:  Has patient fallen in last 6 months? 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   NEXT MD VISIT: NA   OBJECTIVE:    DIAGNOSTIC FINDINGS:  Right knee: X-rays demonstrate severe osteoarthritis.  Bone-on-bone joint space  narrowing with valgus deformity   Low back: X-rays of the lumbar spine show diffuse degenerative changes including a  degenerative L4-5 spondylolisthesis grade 2   PATIENT SURVEYS:  05/11/2023: FOTO 45  04/27/2023: FOTO 42  Eval: FOTO 36 (Goal 46 in 14 visits)   SCREENING FOR RED FLAGS: Bowel or bladder incontinence: No Spinal tumors: No Cauda equina syndrome: No Compression fracture: No Abdominal aneurysm: No   COGNITION: Overall cognitive status: Within functional limits for tasks assessed                          SENSATION: Katryna notes daily right sciatica as distal as the right knee   MUSCLE LENGTH: 05/11/2023: Hamstrings: Right 50 deg; Left 50 deg  04/27/2023: Hamstrings: Right 50 deg; Left 50 deg  Eval: Hamstrings: Right 50 deg; Left 50 deg   POSTURE: rounded shoulders, forward head, and decreased lumbar lordosis     LUMBAR ROM:    AROM 03/30/2023 04/27/2023 05/11/2023  Flexion      Extension 0 0 15  Right lateral flexion      Left lateral flexion      Right rotation      Left rotation       (Blank rows = not tested)   LOWER EXTREMITY ROM:      Passive  Left/Right 03/30/2023  Left/Right 04/27/2023 Left/Right 05/11/2023  Hip flexion 80/80  90/90 95/95  Hip extension       Hip abduction       Hip adduction       Hip internal rotation 4/8   4/9  Hip external rotation 20/20   29/28  Knee flexion 112/116  114/117 115/117  Knee extension 0/0  0/0 0/0  Ankle dorsiflexion       Ankle plantarflexion       Ankle inversion       Ankle eversion        (Blank rows = not tested)   LOWER EXTREMITY STRENGTH:  Deferred at evaluation secondary to time   MMT Left/Right 03/30/2023   Left/Right 04/27/2023 Left/Right 05/11/2023  Hip flexion       Hip extension       Hip abduction       Hip adduction       Hip  internal rotation       Hip external rotation       Knee flexion       Knee extension   66.2/ 65.9 39.0/73.2  Ankle dorsiflexion       Ankle plantarflexion       Ankle inversion       Ankle eversion        (Blank rows = not tested)     GAIT: 04/18/2023:  Independent ambulation with noted antalgic gait with deviation of Rt leg stance with hyperextension/valgus noted on both knees but Rt more than Lt.     Eval: Distance walked: 50 feet Assistive device utilized: None Level of assistance: Complete Independence Comments: Mild flexed posture and valgus right knee noted   TODAY'S TREATMENT:                                                                   DATE:   05/24/2023 Seated straight leg raises 3 sets of 5 for 3 seconds with 2# Hip hike at counter top 2 sets of 10 x 5 seconds, work on avoiding knee hyper-extension and lateral lean Bridging 10 x 5 seconds Quadriceps sets 10 x 5 seconds Tailgate knee flexion 1 minute  Functional Activities: Double leg press with emphasis on full extension and avoiding knee hyper-extension 15 x 87# slow eccentrics Single leg press with emphasis on full extension and avoiding knee hyper-extension 15 x 50# slow eccentrics Discussed priorities post TKA: extension AROM, flexion AROM, quadriceps strength and edema control and added 2 exercises to focus on post-TKA (quad sets and knee flexion AROM)   05/17/2023 Seated straight leg raises 3 sets of 5 for 3 seconds with 1.5# Hip hike at counter top 2 sets of 10 x 3 seconds, work on avoiding knee hyper-extension and lateral lean Bridging 10 x 5 seconds  Functional Activities: Double leg press with emphasis on full extension and avoiding knee hyper-extension 15 x 87# slow eccentrics Single leg press with emphasis on full extension and avoiding knee hyper-extension 15 x 50#  slow eccentrics Discussed her appointment with Dr Roda Shutters and PT recommendations to prepare for surgery   05/11/2023 Seated straight leg raises 3 sets of 5 for 3 seconds with 1.5# Hip hike at counter top 2 sets of 10 x 3 seconds, work on avoiding knee hyper-extension and lateral lean Bridging 10 x 5 seconds  Functional Activities: Double leg press with emphasis on full extension and avoiding knee hyper-extension 15 x 87# slow eccentrics Single leg press with emphasis on full extension and avoiding knee hyper-extension 15 x 62# slow eccentrics Progress note review and HEP update   PATIENT EDUCATION:  Education details: See above Person educated: Patient Education method: Explanation, Demonstration, Tactile cues, Verbal cues, and Handouts Education comprehension: verbalized understanding, returned demonstration, verbal cues required, tactile cues required, and needs further education  HOME EXERCISE PROGRAM: Access Code: [email or web address] URL: [email or web address] Date: 05/24/2023 Prepared by: Pauletta Browns  Exercises - Standing Lumbar Extension at Wall - Forearms  - 3-5 x daily - 7 x weekly - 1 sets - 5 reps - 3 seconds hold - Standing Scapular Retraction  - 3-5 x daily - 7 x weekly - 1 sets - 5 reps - 5 second hold - Standing Hip Hiking  -  3-5 x daily - 7 x weekly - 1 sets - 10 reps - 3 seconds hold - Yoga Bridge  - 1-2 x daily - 7 x weekly - 1 sets - 10 reps - 5 seconds hold - Modified [name] Stretch  - 1-2 x daily - 1 x weekly - 4-5 reps - 20 seconds hold - Supine Hamstring Stretch  - 1-2 x daily - 1 x weekly - 1 sets - 4-5 reps - 20 seconds hold - Supine Figure 4 Piriformis Stretch  - 1-2 x daily - 1 x weekly - 1 sets - 4-5 reps - 20 seconds hold - Prone Hip Extension  - 1 x daily - 7 x weekly - 1-3 sets - 10 reps - 3-10 seconds hold - Hip Flexor Stretch with Chair  - 1-2 x daily - 7 x weekly - 1 sets - 5 reps - 20 seconds hold - Prone Alternating Arm and Leg Lifts  - 1 x daily  - 7 x weekly - 1 sets - 10 reps - 2-10 seconds hold - Supine Quadriceps Sets  - 5 x daily - 7 x weekly - 2 sets - 10 reps - 5 second hold - Seated Knee Flexion AAROM  - 3-5 x daily - 7 x weekly - 1 sets - 1 reps - 3 minutes hold  ASSESSMENT:   CLINICAL IMPRESSION: Tesneem is waiting on getting her TKA scheduled.  Her back and sciatica have been much better and she is preparing for TKA.  We added 2 activities for her to do immediately post-surgery and decided to continue supervised PT until her surgery is scheduled due to her responsibilities at work and caring for family members affecting her HEP time.  I anticipate DC before the end of her current authorization.  OBJECTIVE IMPAIRMENTS: Abnormal gait, decreased activity tolerance, decreased endurance, decreased knowledge of condition, difficulty walking, decreased ROM, decreased strength, decreased safety awareness, increased edema, impaired perceived functional ability, increased muscle spasms, impaired flexibility, improper body mechanics, postural dysfunction, obesity, and pain.    ACTIVITY LIMITATIONS: lifting, bending, standing, squatting, stairs, and locomotion level   PARTICIPATION LIMITATIONS: interpersonal relationship, community activity, and occupation   PERSONAL FACTORS: Severe Rt knee OA, HLD, HTN, pre-diabetes are also affecting patient's functional outcome.    REHAB POTENTIAL: Good   CLINICAL DECISION MAKING: Stable/uncomplicated   EVALUATION COMPLEXITY: Moderate     GOALS: Goals reviewed with patient? Yes   SHORT TERM GOALS: Target date: 04/26/2024   Aldene will be independent with her day 1 HEP Baseline: Started 03/29/2023 Goal status: Met 05/03/2023   2.  Improve lumbar extension active range of motion to at least 5 degrees Baseline: 0 degrees Goal status: Met 05/11/2023   3.  Improve bilateral hip flexion active range of motion to at least 90 degrees and external rotation to at least 30 degrees Baseline: 80  degrees and 20 degrees respectively Goal status: Partially Met 05/11/2023     LONG TERM GOALS: Target date: 06/08/2023   Improve FOTO to 46 Baseline: 36 Goal status: On Going 05/11/2023   2.  Zailey will report low back and bilateral knee pain pain consistently 0-3/10 on the Numeric Pain Rating Scale Baseline: 4-6/10 Goal status: Partially met 05/11/2023   3.  Improve bilateral quadriceps and spine strength as assessed by FOTO scores and objective measures Baseline: FOTO 36, objective measures deferred secondary to time at evaluation Goal status: Partially met 05/11/2023   4.  [place] will report improved weight-bearing function at home  at work as a result of improved back and knee strength and pain Baseline: Limited with weight-bearing function at home and work Goal status: Partially met 05/11/2023   5. Glyn will be independent with her long-term maintenance HEP at DC. Baseline: Started 03/30/2023 Goal status: On Going 05/24/2023   PLAN:   PT FREQUENCY: 1-2x/week   PT DURATION: 2-3 weeks   PLANNED INTERVENTIONS: Therapeutic exercises, Therapeutic activity, Neuromuscular re-education, Balance training, Gait training, Patient/Family education, Self Care, Stair training, Dry Needling, Cryotherapy, Traction, and Manual therapy.   PLAN FOR NEXT SESSION: FOTO and quad strength assessment, update goals.  DC when TKA scheduled before 06/08/2023.   Cherlyn Cushing PT, MPT 05/24/23  10:09 AM

## 2023-05-25 ENCOUNTER — Other Ambulatory Visit: Payer: Self-pay

## 2023-05-25 ENCOUNTER — Ambulatory Visit (INDEPENDENT_AMBULATORY_CARE_PROVIDER_SITE_OTHER): Payer: 59 | Admitting: Rehabilitative and Restorative Service Providers"

## 2023-05-25 ENCOUNTER — Encounter: Payer: Self-pay | Admitting: Rehabilitative and Restorative Service Providers"

## 2023-05-25 ENCOUNTER — Ambulatory Visit: Payer: 59

## 2023-05-25 DIAGNOSIS — M5459 Other low back pain: Secondary | ICD-10-CM | POA: Diagnosis not present

## 2023-05-25 DIAGNOSIS — R293 Abnormal posture: Secondary | ICD-10-CM

## 2023-05-25 DIAGNOSIS — G8929 Other chronic pain: Secondary | ICD-10-CM | POA: Diagnosis not present

## 2023-05-25 DIAGNOSIS — R262 Difficulty in walking, not elsewhere classified: Secondary | ICD-10-CM | POA: Diagnosis not present

## 2023-05-25 DIAGNOSIS — M25561 Pain in right knee: Secondary | ICD-10-CM

## 2023-05-25 DIAGNOSIS — M5416 Radiculopathy, lumbar region: Secondary | ICD-10-CM | POA: Diagnosis not present

## 2023-05-25 NOTE — Progress Notes (Signed)
Patient came in for a height and weight check for surgery.

## 2023-06-25 ENCOUNTER — Encounter (HOSPITAL_COMMUNITY): Admission: RE | Payer: Self-pay | Source: Home / Self Care

## 2023-06-25 ENCOUNTER — Ambulatory Visit (HOSPITAL_COMMUNITY): Admission: RE | Admit: 2023-06-25 | Payer: 59 | Source: Home / Self Care | Admitting: Orthopaedic Surgery

## 2023-06-25 DIAGNOSIS — M1711 Unilateral primary osteoarthritis, right knee: Secondary | ICD-10-CM

## 2023-06-25 SURGERY — ARTHROPLASTY, KNEE, TOTAL
Anesthesia: General | Site: Knee | Laterality: Right

## 2023-07-10 ENCOUNTER — Encounter: Payer: 59 | Admitting: Physician Assistant

## 2023-10-22 DIAGNOSIS — Z01419 Encounter for gynecological examination (general) (routine) without abnormal findings: Secondary | ICD-10-CM | POA: Diagnosis not present

## 2023-10-22 DIAGNOSIS — N3281 Overactive bladder: Secondary | ICD-10-CM | POA: Diagnosis not present

## 2023-10-22 DIAGNOSIS — I10 Essential (primary) hypertension: Secondary | ICD-10-CM | POA: Diagnosis not present

## 2023-10-22 DIAGNOSIS — N393 Stress incontinence (female) (male): Secondary | ICD-10-CM | POA: Diagnosis not present

## 2023-10-23 ENCOUNTER — Other Ambulatory Visit: Payer: Self-pay | Admitting: Family Medicine

## 2023-10-23 DIAGNOSIS — Z1231 Encounter for screening mammogram for malignant neoplasm of breast: Secondary | ICD-10-CM

## 2023-11-06 ENCOUNTER — Ambulatory Visit
Admission: RE | Admit: 2023-11-06 | Discharge: 2023-11-06 | Disposition: A | Discharge status: Home / Self Care | Source: Ambulatory Visit | Attending: Family Medicine

## 2023-11-06 DIAGNOSIS — Z1231 Encounter for screening mammogram for malignant neoplasm of breast: Secondary | ICD-10-CM

## 2024-04-09 DIAGNOSIS — Z1159 Encounter for screening for other viral diseases: Secondary | ICD-10-CM | POA: Diagnosis not present

## 2024-04-15 DIAGNOSIS — Z780 Asymptomatic menopausal state: Secondary | ICD-10-CM | POA: Diagnosis not present

## 2024-04-15 DIAGNOSIS — M810 Age-related osteoporosis without current pathological fracture: Secondary | ICD-10-CM | POA: Diagnosis not present

## 2024-05-05 ENCOUNTER — Encounter: Payer: Self-pay | Admitting: Radiology
